# Patient Record
Sex: Female | Born: 1990 | Hispanic: Yes | Marital: Married | Smoking: Never smoker
Health system: Southern US, Community
[De-identification: ages and names within clinical notes are randomized; demographics above are authoritative.]

---

## 2016-07-12 ENCOUNTER — Observation Stay: Payer: Medicaid Other | Admitting: Anesthesiology

## 2016-07-12 ENCOUNTER — Observation Stay
Admission: EM | Admit: 2016-07-12 | Discharge: 2016-07-13 | Disposition: A | Discharge status: Home / Self Care | Payer: Medicaid Other | Attending: Obstetrics and Gynecology | Admitting: Obstetrics and Gynecology

## 2016-07-12 ENCOUNTER — Encounter
Admission: EM | Disposition: A | Discharge status: Home / Self Care | Payer: Self-pay | Source: Home / Self Care | Attending: Emergency Medicine

## 2016-07-12 ENCOUNTER — Encounter: Payer: Self-pay | Admitting: Emergency Medicine

## 2016-07-12 ENCOUNTER — Emergency Department: Payer: Medicaid Other

## 2016-07-12 DIAGNOSIS — O99211 Obesity complicating pregnancy, first trimester: Secondary | ICD-10-CM | POA: Insufficient documentation

## 2016-07-12 DIAGNOSIS — K661 Hemoperitoneum: Secondary | ICD-10-CM | POA: Insufficient documentation

## 2016-07-12 DIAGNOSIS — Z3A08 8 weeks gestation of pregnancy: Secondary | ICD-10-CM | POA: Insufficient documentation

## 2016-07-12 DIAGNOSIS — O209 Hemorrhage in early pregnancy, unspecified: Secondary | ICD-10-CM | POA: Diagnosis present

## 2016-07-12 DIAGNOSIS — O009 Unspecified ectopic pregnancy without intrauterine pregnancy: Principal | ICD-10-CM | POA: Diagnosis present

## 2016-07-12 HISTORY — PX: DIAGNOSTIC LAPAROSCOPY WITH REMOVAL OF ECTOPIC PREGNANCY: SHX6449

## 2016-07-12 LAB — CBC
HCT: 42.1 % (ref 35.0–47.0)
Hemoglobin: 14.7 g/dL (ref 12.0–16.0)
MCH: 30.9 pg (ref 26.0–34.0)
MCHC: 35 g/dL (ref 32.0–36.0)
MCV: 88.4 fL (ref 80.0–100.0)
PLATELETS: 227 10*3/uL (ref 150–440)
RBC: 4.76 MIL/uL (ref 3.80–5.20)
RDW: 13.6 % (ref 11.5–14.5)
WBC: 8.7 10*3/uL (ref 3.6–11.0)

## 2016-07-12 LAB — COMPREHENSIVE METABOLIC PANEL
ALK PHOS: 66 U/L (ref 38–126)
ALT: 35 U/L (ref 14–54)
AST: 27 U/L (ref 15–41)
Albumin: 4.2 g/dL (ref 3.5–5.0)
Anion gap: 5 (ref 5–15)
BILIRUBIN TOTAL: 0.6 mg/dL (ref 0.3–1.2)
BUN: 16 mg/dL (ref 6–20)
CALCIUM: 9.2 mg/dL (ref 8.9–10.3)
CO2: 28 mmol/L (ref 22–32)
CREATININE: 0.7 mg/dL (ref 0.44–1.00)
Chloride: 104 mmol/L (ref 101–111)
Glucose, Bld: 106 mg/dL — ABNORMAL HIGH (ref 65–99)
Potassium: 3.9 mmol/L (ref 3.5–5.1)
Sodium: 137 mmol/L (ref 135–145)
Total Protein: 7.4 g/dL (ref 6.5–8.1)

## 2016-07-12 LAB — LIPASE, BLOOD: LIPASE: 22 U/L (ref 11–51)

## 2016-07-12 LAB — URINALYSIS COMPLETE WITH MICROSCOPIC (ARMC ONLY)
BILIRUBIN URINE: NEGATIVE
Bacteria, UA: NONE SEEN
GLUCOSE, UA: NEGATIVE mg/dL
HGB URINE DIPSTICK: NEGATIVE
KETONES UR: NEGATIVE mg/dL
LEUKOCYTES UA: NEGATIVE
Nitrite: NEGATIVE
PH: 5 (ref 5.0–8.0)
Protein, ur: NEGATIVE mg/dL
Specific Gravity, Urine: 1.021 (ref 1.005–1.030)

## 2016-07-12 LAB — POCT PREGNANCY, URINE: Preg Test, Ur: POSITIVE — AB

## 2016-07-12 LAB — TYPE AND SCREEN
ABO/RH(D): O POS
ANTIBODY SCREEN: NEGATIVE

## 2016-07-12 LAB — HCG, QUANTITATIVE, PREGNANCY: HCG, BETA CHAIN, QUANT, S: 204 m[IU]/mL — AB (ref <5)

## 2016-07-12 SURGERY — LAPAROSCOPY, WITH ECTOPIC PREGNANCY SURGICAL TREATMENT
Anesthesia: General | Laterality: Left | Wound class: Clean Contaminated

## 2016-07-12 MED ORDER — ONDANSETRON HCL 4 MG/2ML IJ SOLN
4.0000 mg | Freq: Once | INTRAMUSCULAR | Status: AC
  Administered 2016-07-12: 4 mg via INTRAVENOUS
  Filled 2016-07-12: qty 2

## 2016-07-12 MED ORDER — MIDAZOLAM HCL 2 MG/2ML IJ SOLN
INTRAMUSCULAR | Status: DC | PRN
  Administered 2016-07-12: 2 mg via INTRAVENOUS

## 2016-07-12 MED ORDER — IBUPROFEN 600 MG PO TABS: 600.0000 mg | ORAL_TABLET | Freq: Four times a day (QID) | ORAL | 3 refills | Status: AC | PRN

## 2016-07-12 MED ORDER — GLYCOPYRROLATE 0.2 MG/ML IJ SOLN
INTRAMUSCULAR | Status: DC | PRN
  Administered 2016-07-12: 0.4 mg via INTRAVENOUS

## 2016-07-12 MED ORDER — FENTANYL CITRATE (PF) 100 MCG/2ML IJ SOLN
25.0000 ug | INTRAMUSCULAR | Status: DC | PRN
Start: 2016-07-12 — End: 2016-07-13
  Administered 2016-07-12 (×3): 50 ug via INTRAVENOUS

## 2016-07-12 MED ORDER — ROCURONIUM BROMIDE 100 MG/10ML IV SOLN
INTRAVENOUS | Status: DC | PRN
  Administered 2016-07-12: 20 mg via INTRAVENOUS

## 2016-07-12 MED ORDER — PROPOFOL 10 MG/ML IV BOLUS
INTRAVENOUS | Status: DC | PRN
  Administered 2016-07-12: 200 mg via INTRAVENOUS
  Administered 2016-07-12: 100 mg via INTRAVENOUS

## 2016-07-12 MED ORDER — PROMETHAZINE HCL 25 MG/ML IJ SOLN
INTRAMUSCULAR | Status: AC
  Administered 2016-07-12: 6.25 mg via INTRAVENOUS
  Filled 2016-07-12: qty 1

## 2016-07-12 MED ORDER — LACTATED RINGERS IV SOLN
INTRAVENOUS | Status: DC | PRN
  Administered 2016-07-12 (×2): via INTRAVENOUS

## 2016-07-12 MED ORDER — NEOSTIGMINE METHYLSULFATE 10 MG/10ML IV SOLN
INTRAVENOUS | Status: DC | PRN
  Administered 2016-07-12: 4 mg via INTRAVENOUS

## 2016-07-12 MED ORDER — PROMETHAZINE HCL 25 MG/ML IJ SOLN
6.2500 mg | INTRAMUSCULAR | Status: DC | PRN
  Administered 2016-07-12: 6.25 mg via INTRAVENOUS

## 2016-07-12 MED ORDER — SODIUM CHLORIDE 0.9 % IJ SOLN
INTRAMUSCULAR | Status: AC
  Filled 2016-07-12: qty 10

## 2016-07-12 MED ORDER — BUPIVACAINE HCL 0.5 % IJ SOLN
INTRAMUSCULAR | Status: DC | PRN
  Administered 2016-07-12: 10 mL

## 2016-07-12 MED ORDER — OXYCODONE-ACETAMINOPHEN 5-325 MG PO TABS: 1.0000 | ORAL_TABLET | ORAL | 0 refills | Status: AC | PRN

## 2016-07-12 MED ORDER — ACETAMINOPHEN 325 MG PO TABS
650.0000 mg | ORAL_TABLET | Freq: Once | ORAL | Status: AC
  Administered 2016-07-12: 650 mg via ORAL
  Filled 2016-07-12: qty 2

## 2016-07-12 MED ORDER — SUCCINYLCHOLINE CHLORIDE 20 MG/ML IJ SOLN
INTRAMUSCULAR | Status: DC | PRN
  Administered 2016-07-12: 120 mg via INTRAVENOUS

## 2016-07-12 MED ORDER — OXYCODONE HCL 5 MG/5ML PO SOLN: 5.0000 mg | Freq: Once | ORAL | Status: AC | PRN

## 2016-07-12 MED ORDER — FENTANYL CITRATE (PF) 100 MCG/2ML IJ SOLN
INTRAMUSCULAR | Status: DC | PRN
  Administered 2016-07-12: 100 ug via INTRAVENOUS
  Administered 2016-07-12: 50 ug via INTRAVENOUS
  Administered 2016-07-12: 100 ug via INTRAVENOUS

## 2016-07-12 MED ORDER — ONDANSETRON HCL 4 MG/2ML IJ SOLN
INTRAMUSCULAR | Status: DC | PRN
  Administered 2016-07-12: 4 mg via INTRAVENOUS

## 2016-07-12 MED ORDER — LIDOCAINE HCL (CARDIAC) 20 MG/ML IV SOLN
INTRAVENOUS | Status: DC | PRN
  Administered 2016-07-12: 100 mg via INTRAVENOUS

## 2016-07-12 MED ORDER — OXYCODONE HCL 5 MG PO TABS
ORAL_TABLET | ORAL | Status: AC
  Filled 2016-07-12: qty 1

## 2016-07-12 MED ORDER — FENTANYL CITRATE (PF) 100 MCG/2ML IJ SOLN
INTRAMUSCULAR | Status: AC
  Filled 2016-07-12: qty 2

## 2016-07-12 MED ORDER — PRENATAL MULTIVITAMIN CH: 1.0000 | ORAL_TABLET | Freq: Every day | ORAL | Status: DC

## 2016-07-12 MED ORDER — HYDROMORPHONE HCL 1 MG/ML IJ SOLN
0.5000 mg | Freq: Once | INTRAMUSCULAR | Status: AC
  Administered 2016-07-12: 0.5 mg via INTRAVENOUS
  Filled 2016-07-12: qty 1

## 2016-07-12 MED ORDER — MEPERIDINE HCL 25 MG/ML IJ SOLN: 6.2500 mg | INTRAMUSCULAR | Status: DC | PRN

## 2016-07-12 MED ORDER — BUPIVACAINE HCL (PF) 0.5 % IJ SOLN
INTRAMUSCULAR | Status: AC
  Filled 2016-07-12: qty 30

## 2016-07-12 MED ORDER — FENTANYL CITRATE (PF) 100 MCG/2ML IJ SOLN
INTRAMUSCULAR | Status: AC
  Administered 2016-07-12: 50 ug via INTRAVENOUS
  Filled 2016-07-12: qty 2

## 2016-07-12 MED ORDER — OXYCODONE HCL 5 MG PO TABS
5.0000 mg | ORAL_TABLET | Freq: Once | ORAL | Status: AC | PRN
  Administered 2016-07-12: 5 mg via ORAL

## 2016-07-12 SURGICAL SUPPLY — 33 items
ANCHOR TIS RET SYS 235ML (MISCELLANEOUS) ×3 IMPLANT
BAG URO DRAIN 2000ML W/SPOUT (MISCELLANEOUS) IMPLANT
BLADE SURG SZ11 CARB STEEL (BLADE) ×3 IMPLANT
CANISTER SUCT 1200ML W/VALVE (MISCELLANEOUS) ×3 IMPLANT
CATH FOLEY 2WAY  5CC 16FR (CATHETERS)
CATH ROBINSON RED A/P 16FR (CATHETERS) IMPLANT
CATH URTH 16FR FL 2W BLN LF (CATHETERS) IMPLANT
CHLORAPREP W/TINT 26ML (MISCELLANEOUS) ×3 IMPLANT
GLOVE BIO SURGEON STRL SZ7 (GLOVE) ×3 IMPLANT
GLOVE INDICATOR 7.5 STRL GRN (GLOVE) ×3 IMPLANT
GOWN STRL REUS W/ TWL LRG LVL3 (GOWN DISPOSABLE) ×2 IMPLANT
GOWN STRL REUS W/ TWL XL LVL3 (GOWN DISPOSABLE) IMPLANT
GOWN STRL REUS W/TWL LRG LVL3 (GOWN DISPOSABLE) ×4
GOWN STRL REUS W/TWL XL LVL3 (GOWN DISPOSABLE)
GRASPER SUT TROCAR 14GX15 (MISCELLANEOUS) ×3 IMPLANT
IRRIGATION STRYKERFLOW (MISCELLANEOUS) ×1 IMPLANT
IRRIGATOR STRYKERFLOW (MISCELLANEOUS) ×3
IV LACTATED RINGERS 1000ML (IV SOLUTION) ×3 IMPLANT
KIT RM TURNOVER CYSTO AR (KITS) ×3 IMPLANT
LABEL OR SOLS (LABEL) IMPLANT
LIQUID BAND (GAUZE/BANDAGES/DRESSINGS) ×3 IMPLANT
NS IRRIG 500ML POUR BTL (IV SOLUTION) ×3 IMPLANT
PACK GYN LAPAROSCOPIC (MISCELLANEOUS) ×3 IMPLANT
PAD OB MATERNITY 4.3X12.25 (PERSONAL CARE ITEMS) ×3 IMPLANT
PAD PREP 24X41 OB/GYN DISP (PERSONAL CARE ITEMS) ×3 IMPLANT
SCISSORS METZENBAUM CVD 33 (INSTRUMENTS) IMPLANT
SHEARS HARMONIC ACE PLUS 36CM (ENDOMECHANICALS) ×3 IMPLANT
SLEEVE ENDOPATH XCEL 5M (ENDOMECHANICALS) ×3 IMPLANT
SUT MNCRL AB 4-0 PS2 18 (SUTURE) ×3 IMPLANT
SUT VIC AB 2-0 UR6 27 (SUTURE) ×3 IMPLANT
TROCAR ENDO BLADELESS 11MM (ENDOMECHANICALS) ×3 IMPLANT
TROCAR XCEL NON-BLD 5MMX100MML (ENDOMECHANICALS) ×3 IMPLANT
TUBING INSUFFLATOR HI FLOW (MISCELLANEOUS) ×3 IMPLANT

## 2016-07-12 NOTE — H&P (Signed)
Obstetrics & Gynecology Consult H&P    Chief Complaint:  Abdominal pain   History of Present Illness: Patient is a 25 y.o. with acute onset abdominal pain, also some light vaginal bleeding.  She was unaware of her pregnancy until positive UPT here in the ER.  Unsure LMP sometime in August.  Denies contraceptive use, history of PID, GC or CT.  ER evaluation here revealed HCG level in the 200's no visualized IUP, thin endometrial stripe, right corpus luteum and a complex looking mass on the left adnexa with what appear to be a low volume of hemoperitoneum.    Review of Systems:10 point review of systems  Past Medical History:  History reviewed. No pertinent past medical history.  Past Surgical History:  History reviewed. No pertinent surgical history.  Gynecologic History: denies history of STI or abnormal paps  Obstetric History: G1  Family History:  History reviewed. No pertinent family history.  Social History:  Social History   Social History  . Marital status: Married    Spouse name: N/A  . Number of children: N/A  . Years of education: N/A   Occupational History  . Not on file.   Social History Main Topics  . Smoking status: Never Smoker  . Smokeless tobacco: Never Used  . Alcohol use No  . Drug use: No  . Sexual activity: Not on file   Other Topics Concern  . Not on file   Social History Narrative  . No narrative on file    Allergies:  No Known Allergies  Medications: Prior to Admission medications   Not on File    Physical Exam Vitals: Blood pressure 128/84, pulse 94, temperature 98.4 F (36.9 C), temperature source Oral, resp. rate 18, height '5\' 5"'  (1.651 m), weight 230 lb (104.3 kg), last menstrual period 05/14/2016, SpO2 100 %. General: NAD HEENT: normocephalic, anicteric Pulmonary: no increased work of breathing Cardiovascular: RRR Abdomen: NABS, soft, non-distended, quite tender with some voluntary guarding and rebound Genitourinary:  deferred Extremities: no edema Neurologic: grossly intact Psychiatric: mood appropriate, affect full  Labs: Results for orders placed or performed during the hospital encounter of 07/12/16 (from the past 72 hour(s))  Lipase, blood     Status: None   Collection Time: 07/12/16  3:12 PM  Result Value Ref Range   Lipase 22 11 - 51 U/L  Comprehensive metabolic panel     Status: Abnormal   Collection Time: 07/12/16  3:12 PM  Result Value Ref Range   Sodium 137 135 - 145 mmol/L   Potassium 3.9 3.5 - 5.1 mmol/L   Chloride 104 101 - 111 mmol/L   CO2 28 22 - 32 mmol/L   Glucose, Bld 106 (H) 65 - 99 mg/dL   BUN 16 6 - 20 mg/dL   Creatinine, Ser 0.70 0.44 - 1.00 mg/dL   Calcium 9.2 8.9 - 10.3 mg/dL   Total Protein 7.4 6.5 - 8.1 g/dL   Albumin 4.2 3.5 - 5.0 g/dL   AST 27 15 - 41 U/L   ALT 35 14 - 54 U/L   Alkaline Phosphatase 66 38 - 126 U/L   Total Bilirubin 0.6 0.3 - 1.2 mg/dL   GFR calc non Af Amer >60 >60 mL/min   GFR calc Af Amer >60 >60 mL/min    Comment: (NOTE) The eGFR has been calculated using the CKD EPI equation. This calculation has not been validated in all clinical situations. eGFR's persistently <60 mL/min signify possible Chronic Kidney Disease.  Anion gap 5 5 - 15  CBC     Status: None   Collection Time: 07/12/16  3:12 PM  Result Value Ref Range   WBC 8.7 3.6 - 11.0 K/uL   RBC 4.76 3.80 - 5.20 MIL/uL   Hemoglobin 14.7 12.0 - 16.0 g/dL   HCT 42.1 35.0 - 47.0 %   MCV 88.4 80.0 - 100.0 fL   MCH 30.9 26.0 - 34.0 pg   MCHC 35.0 32.0 - 36.0 g/dL   RDW 13.6 11.5 - 14.5 %   Platelets 227 150 - 440 K/uL  Urinalysis complete, with microscopic     Status: Abnormal   Collection Time: 07/12/16  3:12 PM  Result Value Ref Range   Color, Urine YELLOW (A) YELLOW   APPearance CLEAR (A) CLEAR   Glucose, UA NEGATIVE NEGATIVE mg/dL   Bilirubin Urine NEGATIVE NEGATIVE   Ketones, ur NEGATIVE NEGATIVE mg/dL   Specific Gravity, Urine 1.021 1.005 - 1.030   Hgb urine dipstick  NEGATIVE NEGATIVE   pH 5.0 5.0 - 8.0   Protein, ur NEGATIVE NEGATIVE mg/dL   Nitrite NEGATIVE NEGATIVE   Leukocytes, UA NEGATIVE NEGATIVE   RBC / HPF 0-5 0 - 5 RBC/hpf   WBC, UA 0-5 0 - 5 WBC/hpf   Bacteria, UA NONE SEEN NONE SEEN   Squamous Epithelial / LPF 0-5 (A) NONE SEEN   Mucous PRESENT   hCG, quantitative, pregnancy     Status: Abnormal   Collection Time: 07/12/16  3:12 PM  Result Value Ref Range   hCG, Beta Chain, Quant, S 204 (H) <5 mIU/mL    Comment:          GEST. AGE      CONC.  (mIU/mL)   <=1 WEEK        5 - 50     2 WEEKS       50 - 500     3 WEEKS       100 - 10,000     4 WEEKS     1,000 - 30,000     5 WEEKS     3,500 - 115,000   6-8 WEEKS     12,000 - 270,000    12 WEEKS     15,000 - 220,000        FEMALE AND NON-PREGNANT FEMALE:     LESS THAN 5 mIU/mL   Pregnancy, urine POC     Status: Abnormal   Collection Time: 07/12/16  3:25 PM  Result Value Ref Range   Preg Test, Ur POSITIVE (A) NEGATIVE    Comment:        THE SENSITIVITY OF THIS METHODOLOGY IS >24 mIU/mL   Type and screen     Status: None   Collection Time: 07/12/16  4:59 PM  Result Value Ref Range   ABO/RH(D) O POS    Antibody Screen NEG    Sample Expiration 07/15/2016     Imaging US Ob Comp Less 14 Wks  Result Date: 07/12/2016 CLINICAL DATA:  Abdominal pain, vaginal bleeding. Estimated gestational age by last menstrual period equals 8 weeks 3 days EXAM: OBSTETRIC <14 WK [place] AND TRANSVAGINAL OB US TECHNIQUE: Both transabdominal and transvaginal ultrasound examinations were performed for complete evaluation of the gestation as well as the maternal uterus, adnexal regions, and pelvic cul-de-sac. Transvaginal technique was performed to assess early pregnancy. COMPARISON:  Non FINDINGS: Intrauterine gestational sac: Not identified Yolk sac:  Not identified Embryo:  Not identified Subchorionic hemorrhage:  None  visualized. Maternal uterus/adnexae: Rounded thick-walled hypoechoic lesion in the RIGHT ovary  may represent a corpus luteal cyst. LEFT ovary measures 2.1 x 1.8 x 2.1 cm. Hypoechoic ill-defined tissue adjacent to LEFT ovary measures 3.3 x 3.2 x 3.3 cm. Minimal peripheral blood flow. Moderate volume of mildly echogenic fluid suggests some blood in the posterior cul-de-sac. IMPRESSION: 1. No intrauterine gestation. 2. Potential corpus luteal cyst of the RIGHT ovary. 3. Ill-defined mass/tissue adjacent to the LEFT ovary with moderate volume of mildly complex free fluid suggesting peritoneal blood. Findings are concerning for but not entirely specific for ectopic pregnancy. Findings conveyed Delaine Lame on 07/12/2016  at18:59. Electronically Signed   By: Suzy Bouchard M.D.   On: 07/12/2016 19:01   US Ob Transvaginal  Result Date: 07/12/2016 CLINICAL DATA:  Abdominal pain, vaginal bleeding. Estimated gestational age by last menstrual period equals 8 weeks 3 days EXAM: OBSTETRIC <14 WK [place] AND TRANSVAGINAL OB US TECHNIQUE: Both transabdominal and transvaginal ultrasound examinations were performed for complete evaluation of the gestation as well as the maternal uterus, adnexal regions, and pelvic cul-de-sac. Transvaginal technique was performed to assess early pregnancy. COMPARISON:  Non FINDINGS: Intrauterine gestational sac: Not identified Yolk sac:  Not identified Embryo:  Not identified Subchorionic hemorrhage:  None visualized. Maternal uterus/adnexae: Rounded thick-walled hypoechoic lesion in the RIGHT ovary may represent a corpus luteal cyst. LEFT ovary measures 2.1 x 1.8 x 2.1 cm. Hypoechoic ill-defined tissue adjacent to LEFT ovary measures 3.3 x 3.2 x 3.3 cm. Minimal peripheral blood flow. Moderate volume of mildly echogenic fluid suggests some blood in the posterior cul-de-sac. IMPRESSION: 1. No intrauterine gestation. 2. Potential corpus luteal cyst of the RIGHT ovary. 3. Ill-defined mass/tissue adjacent to the LEFT ovary with moderate volume of mildly complex free fluid suggesting peritoneal  blood. Findings are concerning for but not entirely specific for ectopic pregnancy. Findings conveyed Delaine Lame on 07/12/2016  at18:59. Electronically Signed   By: Suzy Bouchard M.D.   On: 07/12/2016 19:01    Assessment: 25 y.o. G1 with apparent ruptured right ectopic pregnancy  Plan:  1) Right ectopic pregnancy - differential includes tubal abortion, ruptured ovarian cyst.  We discussed expectant management with serial CBC and repeat HCG in 48-hrs (inpatient) vs surgical evaluation given high enough suspicion of ectopic.  We also discussed MTX therapy but given hemoperitoneum (although small) this is a contraindication to MTX therapy.  We discussed risk benefits of surgery and patient elects to proceed to the OR - proceed to OR for diagnostic laparoscopy - NPO - no preoperative antibiotics - SCDs

## 2016-07-12 NOTE — Pre-Procedure Instructions (Signed)
Script for oxycodone and ibuprofen given to husband.

## 2016-07-12 NOTE — Op Note (Signed)
Preoperative Diagnosis: 1) 25 y.o.  G1 with ruptured left ectopic pregnancy  Postoperative Diagnosis: 1) 25 y.o. G1 with ruptured left ectopic pregnancy   Operation Performed: Laparoscopic left salpingectomy  Indication: 25 y.o. G1 presenting with abdominal pain, positive HCG 200, and findings consistent with hemoperitoneum  Anesthesia: General  Preoperative Antibiotics: none  Estimated Blood Loss: minimal, 200mL of hemoperitoneum evacuated  IV Fluids: 400mL  Drains or Tubes: none  Implants: none  Specimens Removed: left portion of fallopian tube  Complications: none  Intraoperative Findings: Normal right tubes, ovaries, and uterus.  Ruptured ectopic pregnancy near the fimbriated portion of the left fallopian tube, 200mL of hemoperitoneum  Patient Condition: stable  Procedure in Detail:  Patient was taken to the operating room where she was administered general anesthesia.  She was positioned in the supine position, prepped and draped in the usual sterile fashion.  Prior to proceeding with procedure a time out was performed.  Attention was turned to the patient's pelvis.  An indwelling foley catheter was used to decompress the patient's bladder.   Attention was turned to the patient's abdomen.  The umbilicus was infiltrated with 1% Sensorcaine, before making a stab incision using an 11 blade scalpel.  A 5mm Excel trocar was then used to gain direct entry into the peritoneal cavity utilizing the camera to visualize progress of the trocar during placement.  Once peritoneal entry had been achieved, insufflation was started and pneumoperitoneum established at a pressure of 15mmHg.   Palmers point injected with 1% Sensorcaine and a stab incision was made using an 11 blade scalpel.  A second 5mm excel trocar was place placed at Palmer's point under direct visualization. A third 11mm excel trocar was placed in similar fashion.  Inspection revealed the above noted findings  Hemoperitoneum  was evacuted using a suction irrigator.  The ectopic was mobilized out of the pelvis and transected from it attachments to the ovary and mesosalpinx using a 5mm harmonic device.  The specimen was placed in an endocatch bag and removed through the 11mm port site.  The pedicle was inspected noted to hemostatic. Ureter was visualized peristalsis noted and well away from the site of dissection.  The 11mm port site was closed with a carter thompson and 0 Vicryl stitch.  Pneumoperitoneum was evacuated.  The trocars were removed.  The 11mm trocar site was closed with 4-0 Monocryl in a subcuticular fashion.  All trocar sites were then dressed with surgical skin glue.   Sponge needle and instrument counts were correct time two.  The patient tolerated the procedure well and was taken to the recovery room in stable condition.

## 2016-07-12 NOTE — ED Notes (Signed)
POC Preg POSITIVE.

## 2016-07-12 NOTE — Anesthesia Preprocedure Evaluation (Signed)
Anesthesia Evaluation  Patient identified by MRN, date of birth, ID band Patient awake    Reviewed: Allergy & Precautions, NPO status , Patient's Chart, lab work & pertinent test results  History of Anesthesia Complications Negative for: history of anesthetic complications  Airway Mallampati: II  TM Distance: >3 FB Neck ROM: Full    Dental   Pulmonary neg pulmonary ROS, neg sleep apnea, neg COPD,    breath sounds clear to auscultation- rhonchi (-) wheezing      Cardiovascular Exercise Tolerance: Good (-) hypertension(-) CAD and (-) Past MI  Rhythm:Regular Rate:Normal - Systolic murmurs and - Diastolic murmurs    Neuro/Psych negative neurological ROS  negative psych ROS   GI/Hepatic negative GI ROS, Neg liver ROS,   Endo/Other  negative endocrine ROSneg diabetes  Renal/GU negative Renal ROS     Musculoskeletal   Abdominal (+) + obese,   Peds  Hematology negative hematology ROS (+)   Anesthesia Other Findings Likely ruptured ectopic pregnancy  Reproductive/Obstetrics                             Anesthesia Physical Anesthesia Plan  ASA: II  Anesthesia Plan: General   Post-op Pain Management:    Induction: Intravenous  Airway Management Planned: Oral ETT  Additional Equipment:   Intra-op Plan:   Post-operative Plan: Extubation in OR  Informed Consent: I have reviewed the patients History and Physical, chart, labs and discussed the procedure including the risks, benefits and alternatives for the proposed anesthesia with the patient or authorized representative who has indicated his/her understanding and acceptance.   Dental advisory given  Plan Discussed with: Anesthesiologist and CRNA  Anesthesia Plan Comments:         Anesthesia Quick Evaluation

## 2016-07-12 NOTE — ED Triage Notes (Signed)
Pt c/o lower abd pain worse on L side and vaginal spotting since yesterday.

## 2016-07-12 NOTE — Anesthesia Procedure Notes (Signed)
Procedure Name: Intubation Date/Time: 07/12/2016 9:10 PM Performed by: Clovis FredricksonRISSON, Vani Gunner Pre-anesthesia Checklist: Patient identified, Emergency Drugs available, Suction available, Patient being monitored and Timeout performed Patient Re-evaluated:Patient Re-evaluated prior to inductionOxygen Delivery Method: Circle system utilized Preoxygenation: Pre-oxygenation with 100% oxygen Intubation Type: IV induction Ventilation: Mask ventilation without difficulty Laryngoscope Size: Mac and 4 Grade View: Grade I Tube type: Oral Tube size: 7.0 mm Number of attempts: 1 Airway Equipment and Method: Stylet Placement Confirmation: ETT inserted through vocal cords under direct vision,  positive ETCO2,  CO2 detector and breath sounds checked- equal and bilateral Secured at: 22 cm Tube secured with: Tape Dental Injury: Teeth and Oropharynx as per pre-operative assessment

## 2016-07-12 NOTE — Transfer of Care (Signed)
Immediate Anesthesia Transfer of Care Note  Patient: Bianca Pena  Procedure(s) Performed: Procedure(s): DIAGNOSTIC LAPAROSCOPY WITH REMOVAL OF ECTOPIC PREGNANCY (Left)  Patient Location: PACU  Anesthesia Type:General  Level of Consciousness: awake, alert  and oriented  Airway & Oxygen Therapy: Patient Spontanous Breathing and Patient connected to face mask oxygen  Post-op Assessment: Report given to RN and Post -op Vital signs reviewed and stable  Post vital signs: Reviewed and stable  Last Vitals:  Vitals:   07/12/16 2000 07/12/16 2015  BP: 126/70   Pulse: (!) 115 (!) 102  Resp: 20 20  Temp:      Last Pain:  Vitals:   07/12/16 1838  TempSrc:   PainSc: 4          Complications: No apparent anesthesia complications

## 2016-07-12 NOTE — ED Provider Notes (Signed)
Time Seen: Approximately 1653  I have reviewed the triage notes  Chief Complaint: Abdominal Pain and Vaginal Bleeding   History of Present Illness: Bianca Pena is a 25 y.o. female *who is now gravida 1 para 0 unknown weeks pregnant. Patient states she noticed some lower middle quadrant abdominal pain which radiates toward the left lower quadrant and some spotty vaginal bleeding since yesterday. He denies any fever, dysuria or hematuria. She denies any vaginal discharge. She states she thinks she had a menstrual period last month that she's not sure of the dates. Denies any heavy vaginal bleeding back or flank pain    History reviewed. No pertinent past medical history.  There are no active problems to display for this patient.   History reviewed. No pertinent surgical history.  History reviewed. No pertinent surgical history.    Allergies:  Review of patient's allergies indicates no known allergies.  Family History: History reviewed. No pertinent family history.  Social History: Social History  Substance Use Topics  . Smoking status: Never Smoker  . Smokeless tobacco: Never Used  . Alcohol use No     Review of Systems:   10 point review of systems was performed and was otherwise negative:  Constitutional: No fever Eyes: No visual disturbances ENT: No sore throat, ear pain Cardiac: No chest pain Respiratory: No shortness of breath, wheezing, or stridor Abdomen: Mostly left lower quadrant. No nausea, vomiting, shortness of breath Endocrine: No weight loss, No night sweats Extremities: No peripheral edema, cyanosis Skin: No rashes, easy bruising Neurologic: No focal weakness, trouble with speech or swollowing Urologic: No dysuria, Hematuria, or urinary frequency   Physical Exam:  ED Triage Vitals  Enc Vitals Group     BP 07/12/16 1506 131/83     Pulse Rate 07/12/16 1506 (!) 101     Resp 07/12/16 1506 18     Temp 07/12/16 1506 98.4 F (36.9 C)   Temp Source 07/12/16 1506 Oral     SpO2 07/12/16 1506 99 %     Weight 07/12/16 1506 230 lb (104.3 kg)     Height 07/12/16 1506 5\' 5"  (1.651 m)     Head Circumference --      Peak Flow --      Pain Score 07/12/16 1507 6     Pain Loc --      Pain Edu? --      Excl. in GC? --     General: Awake , Alert , and Oriented times 3; GCS 15 Head: Normal cephalic , atraumatic Eyes: Pupils equal , round, reactive to light Nose/Throat: No nasal drainage, patent upper airway without erythema or exudate.  Neck: Supple, Full range of motion, No anterior adenopathy or palpable thyroid masses Lungs: Clear to ascultation without wheezes , rhonchi, or rales Heart: Regular rate, regular rhythm without murmurs , gallops , or rubs Abdomen: Tender lower middle quadrant and left lower quadrant without a palpable masses. No tenderness over McBurney's point, negative Murphy's sign. Some mild tenderness in the left upper quadrant.        Extremities: 2 plus symmetric pulses. No edema, clubbing or cyanosis Neurologic: normal ambulation, Motor symmetric without deficits, sensory intact Skin: warm, dry, no rashes   Labs:   All laboratory work was reviewed including any pertinent negatives or positives listed below:  Labs Reviewed  COMPREHENSIVE METABOLIC PANEL - Abnormal; Notable for the following:       Result Value   Glucose, Bld 106 (*)    All  other components within normal limits  URINALYSIS COMPLETEWITH MICROSCOPIC ([facility] ONLY) - Abnormal; Notable for the following:    Color, Urine YELLOW (*)    APPearance CLEAR (*)    Squamous Epithelial / LPF 0-5 (*)    All other components within normal limits  POCT PREGNANCY, URINE - Abnormal; Notable for the following:    Preg Test, Ur POSITIVE (*)    All other components within normal limits  LIPASE, BLOOD  CBC  HCG, QUANTITATIVE, PREGNANCY  POC URINE PREG, ED  TYPE AND SCREEN    Radiology:  "US Ob Comp Less 14 Wks  Result Date: 07/12/2016 CLINICAL  DATA:  Abdominal pain, vaginal bleeding. Estimated gestational age by last menstrual period equals 8 weeks 3 days EXAM: OBSTETRIC <14 WK [place] AND TRANSVAGINAL OB US TECHNIQUE: Both transabdominal and transvaginal ultrasound examinations were performed for complete evaluation of the gestation as well as the maternal uterus, adnexal regions, and pelvic cul-de-sac. Transvaginal technique was performed to assess early pregnancy. COMPARISON:  Non FINDINGS: Intrauterine gestational sac: Not identified Yolk sac:  Not identified Embryo:  Not identified Subchorionic hemorrhage:  None visualized. Maternal uterus/adnexae: Rounded thick-walled hypoechoic lesion in the RIGHT ovary may represent a corpus luteal cyst. LEFT ovary measures 2.1 x 1.8 x 2.1 cm. Hypoechoic ill-defined tissue adjacent to LEFT ovary measures 3.3 x 3.2 x 3.3 cm. Minimal peripheral blood flow. Moderate volume of mildly echogenic fluid suggests some blood in the posterior cul-de-sac. IMPRESSION: 1. No intrauterine gestation. 2. Potential corpus luteal cyst of the RIGHT ovary. 3. Ill-defined mass/tissue adjacent to the LEFT ovary with moderate volume of mildly complex free fluid suggesting peritoneal blood. Findings are concerning for but not entirely specific for ectopic pregnancy. Findings conveyed Charlesetta Shanks on 07/12/2016  at18:59. Electronically Signed   By: Genevive Bi M.D.   On: 07/12/2016 19:01   US Ob Transvaginal  Result Date: 07/12/2016 CLINICAL DATA:  Abdominal pain, vaginal bleeding. Estimated gestational age by last menstrual period equals 8 weeks 3 days EXAM: OBSTETRIC <14 WK [place] AND TRANSVAGINAL OB US TECHNIQUE: Both transabdominal and transvaginal ultrasound examinations were performed for complete evaluation of the gestation as well as the maternal uterus, adnexal regions, and pelvic cul-de-sac. Transvaginal technique was performed to assess early pregnancy. COMPARISON:  Non FINDINGS: Intrauterine gestational sac: Not identified  Yolk sac:  Not identified Embryo:  Not identified Subchorionic hemorrhage:  None visualized. Maternal uterus/adnexae: Rounded thick-walled hypoechoic lesion in the RIGHT ovary may represent a corpus luteal cyst. LEFT ovary measures 2.1 x 1.8 x 2.1 cm. Hypoechoic ill-defined tissue adjacent to LEFT ovary measures 3.3 x 3.2 x 3.3 cm. Minimal peripheral blood flow. Moderate volume of mildly echogenic fluid suggests some blood in the posterior cul-de-sac. IMPRESSION: 1. No intrauterine gestation. 2. Potential corpus luteal cyst of the RIGHT ovary. 3. Ill-defined mass/tissue adjacent to the LEFT ovary with moderate volume of mildly complex free fluid suggesting peritoneal blood. Findings are concerning for but not entirely specific for ectopic pregnancy. Findings conveyed Charlesetta Shanks on 07/12/2016  at18:59. Electronically Signed   By: Genevive Bi M.D.   On: 07/12/2016 19:01  "  I personally reviewed the radiologic studies    ED Course:  Patient's ultrasound shows concerns for possible ectopic pregnancy. I spoke to the OB/GYN on call Dr. Bonney Aid. We will follow his recommendations he is agreed to see and evaluate the patient.  She was given Tylenol and Dilaudid for pain control Clinical Course     Assessment:  Possible ectopic pregnancy  Plan:  OB/GYN consultation            Jennye MoccasinBrian S Ronnisha Felber, MD 07/12/16 1944

## 2016-07-13 ENCOUNTER — Encounter: Payer: Self-pay | Admitting: Obstetrics and Gynecology

## 2016-07-13 NOTE — Care Management Note (Signed)
Case Management Note  Patient Details  Name: Bianca Pena MRN: [id number] Date of Birth: 05/15/1991  Subjective/Objective:  Patient went from the emergency room to the the operating room.                 Action/Plan: Patient was discharged home from PACU.    Expected Discharge Date:                  Expected Discharge Plan:     In-House Referral:     Discharge planning Services     Post Acute Care Choice:    Choice offered to:     DME Arranged:    DME Agency:     HH Arranged:    HH Agency:     Status of Service:     If discussed at [employer] Meetings, dates discussed:    Additional Comments:  Caren MacadamMichelle Ziya Coonrod, RN 07/13/2016, 1:16 PM

## 2016-07-13 NOTE — Anesthesia Postprocedure Evaluation (Signed)
Anesthesia Post Note  Patient: Bianca Pena  Procedure(s) Performed: Procedure(s) (LRB): DIAGNOSTIC LAPAROSCOPY WITH REMOVAL OF ECTOPIC PREGNANCY (Left)  Patient location during evaluation: PACU Anesthesia Type: General Level of consciousness: awake and alert and oriented Pain management: pain level controlled Vital Signs Assessment: post-procedure vital signs reviewed and stable Respiratory status: spontaneous breathing, nonlabored ventilation and respiratory function stable Cardiovascular status: blood pressure returned to baseline and stable Postop Assessment: no signs of nausea or vomiting Anesthetic complications: no    Last Vitals:  Vitals:   07/12/16 2330 07/13/16 0000  BP: 129/78 130/76  Pulse: (!) 101 84  Resp: 19 18  Temp:      Last Pain:  Vitals:   07/13/16 0000  TempSrc:   PainSc: 3                  [name]

## 2016-07-16 LAB — SURGICAL PATHOLOGY

## 2016-07-16 NOTE — Discharge Instructions (Signed)
Contact your physician with any problems or Same Day Surgery at [phone], Monday through Friday 6 am to 4 pm, or [place] at [facility] Main number at [phone].   CIRUGIA AMBULATORIA       Instruccionnes de alta    Date Bianca Pena(Fecha) 07/12/16 Saturday   1.  Las drogas que se Dispensing opticianle administraron permaneceran en su cuerpo [employer]asta [place], asi      que por las proximas 24 horas usted no debe:   Conducir Field seismologist(manejar) un automovil   Hacer ninguna decision legal   Tomar ninguna bebida alcoholica  2.  A) [place] puede comenzar una dieta regular.  Es mejor que hoy empiece con                    liquidos y gradualmente anada [address].       B) Puede comer cualquier comida que desee pero es mejor empezar con liquidos,               luego sopitas con galletas saladas y gradualmente llegar a las comidas solidas.  3.  Por favor avise a su medico inmediatamente si usted tiene algun sangrado anormal,       tiene dificultad con la respiracion, enrojecimiento y Engineer, miningdolor en el sitio de la cirugia,     [place], fiebro o dolor que se alivia con [place] [facility]icina.  4.  A) Su visita posoperatoria (despues de su operacion) es con el  Dr.Staebler Date                     Time        B)  Por favor llame para hacer la cita posoperatoria.  5.  Istrucciones especificas : AMBULATORY SURGERY  DISCHARGE INSTRUCTIONS   1) The drugs that you were given will stay in your system until tomorrow so for the next 24 hours you should not:  A) Drive an automobile B) Make any legal decisions C) Drink any alcoholic beverage   2) You may resume regular meals tomorrow.  Today it is better to start with liquids and gradually work up to solid foods.  You may eat anything you prefer, but it is better to start with liquids, then soup and crackers, and gradually work up to solid foods.   3) Please notify your doctor immediately if you have any unusual bleeding, trouble breathing, redness and pain at the surgery site, drainage, fever, or  pain not relieved by medication.    4) Additional Instructions: See ectopic pregnancy sheet        Please contact your physician with any problems or Same Day Surgery at [phone], Monday through Friday 6 am to 4 pm, or [place] [facility] Main number at [phone].

## 2016-10-20 ENCOUNTER — Encounter: Payer: Self-pay | Admitting: Emergency Medicine

## 2016-10-20 ENCOUNTER — Emergency Department
Admission: EM | Admit: 2016-10-20 | Discharge: 2016-10-20 | Disposition: A | Discharge status: Home / Self Care | Payer: Medicaid Other | Attending: Emergency Medicine | Admitting: Emergency Medicine

## 2016-10-20 DIAGNOSIS — N760 Acute vaginitis: Secondary | ICD-10-CM | POA: Insufficient documentation

## 2016-10-20 DIAGNOSIS — B9689 Other specified bacterial agents as the cause of diseases classified elsewhere: Secondary | ICD-10-CM

## 2016-10-20 LAB — COMPREHENSIVE METABOLIC PANEL
ALK PHOS: 72 U/L (ref 38–126)
ALT: 39 U/L (ref 14–54)
ANION GAP: 8 (ref 5–15)
AST: 31 U/L (ref 15–41)
Albumin: 4.2 g/dL (ref 3.5–5.0)
BILIRUBIN TOTAL: 0.6 mg/dL (ref 0.3–1.2)
BUN: 19 mg/dL (ref 6–20)
CALCIUM: 8.9 mg/dL (ref 8.9–10.3)
CO2: 25 mmol/L (ref 22–32)
CREATININE: 0.66 mg/dL (ref 0.44–1.00)
Chloride: 103 mmol/L (ref 101–111)
GFR calc non Af Amer: >60 mL/min (ref >60)
GLUCOSE: 81 mg/dL (ref 65–99)
Potassium: 3.5 mmol/L (ref 3.5–5.1)
SODIUM: 136 mmol/L (ref 135–145)
TOTAL PROTEIN: 7.7 g/dL (ref 6.5–8.1)

## 2016-10-20 LAB — URINALYSIS, COMPLETE (UACMP) WITH MICROSCOPIC
Bilirubin Urine: NEGATIVE
GLUCOSE, UA: NEGATIVE mg/dL
HGB URINE DIPSTICK: NEGATIVE
Ketones, ur: NEGATIVE mg/dL
Leukocytes, UA: NEGATIVE
NITRITE: NEGATIVE
PH: 5 (ref 5.0–8.0)
Protein, ur: NEGATIVE mg/dL
Specific Gravity, Urine: 1.025 (ref 1.005–1.030)

## 2016-10-20 LAB — WET PREP, GENITAL
SPERM: NONE SEEN
TRICH WET PREP: NONE SEEN
Yeast Wet Prep HPF POC: NONE SEEN

## 2016-10-20 LAB — HCG, QUANTITATIVE, PREGNANCY: hCG, Beta Chain, Quant, S: <1 m[IU]/mL (ref <5)

## 2016-10-20 LAB — CBC
HCT: 42.2 % (ref 35.0–47.0)
HEMOGLOBIN: 14.8 g/dL (ref 12.0–16.0)
MCH: 30.8 pg (ref 26.0–34.0)
MCHC: 35 g/dL (ref 32.0–36.0)
MCV: 87.9 fL (ref 80.0–100.0)
PLATELETS: 237 10*3/uL (ref 150–440)
RBC: 4.81 MIL/uL (ref 3.80–5.20)
RDW: 13.1 % (ref 11.5–14.5)
WBC: 10.4 10*3/uL (ref 3.6–11.0)

## 2016-10-20 LAB — CHLAMYDIA/NGC RT PCR (ARMC ONLY)
CHLAMYDIA TR: NOT DETECTED
N gonorrhoeae: NOT DETECTED

## 2016-10-20 LAB — LIPASE, BLOOD: Lipase: 19 U/L (ref 11–51)

## 2016-10-20 MED ORDER — METRONIDAZOLE 500 MG PO TABS: 500.0000 mg | ORAL_TABLET | Freq: Two times a day (BID) | ORAL | 0 refills | Status: AC

## 2016-10-20 NOTE — ED Triage Notes (Signed)
abd pain for couple days worse since yesterday.  Low abd.  Hx ectopic.

## 2016-10-20 NOTE — ED Provider Notes (Signed)
[facility] Emergency Department Provider Note  Time seen: 6:28 PM  I have reviewed the triage vital signs and the nursing notes.   HISTORY  Chief Complaint Abdominal Pain    HPI Bianca Pena is a 26 y.o. female with a past medical history of ectopic pregnancy presents to the emergency department for lower abdominal pain for the past 3 days. According to the patient for the past 3 days she has been experiencing lower abdominal discomfort especially in the suprapubic region. Denies any dysuria. Does state for the past 2 weeks she's been having thicker vaginal discharge. Does not believe she is at risk for STDs, states she was tested approximately 2 months ago and is only been with the same sexual partner since that time. Denies any fever, nausea, vomiting, diarrhea. Denies vaginal bleeding.  History reviewed. No pertinent past medical history.  Patient Active Problem List   Diagnosis Date Noted  . Ectopic pregnancy 07/12/2016    Past Surgical History:  Procedure Laterality Date  . DIAGNOSTIC LAPAROSCOPY WITH REMOVAL OF ECTOPIC PREGNANCY Left 07/12/2016   Procedure: DIAGNOSTIC LAPAROSCOPY WITH REMOVAL OF ECTOPIC PREGNANCY;  Surgeon: Vena AustriaAndreas Staebler, MD;  Location: [facility] ORS;  Service: Gynecology;  Laterality: Left;    Prior to Admission medications   Medication Sig Start Date End Date Taking? Authorizing Provider  ibuprofen (ADVIL,MOTRIN) 600 MG tablet Take 1 tablet (600 mg total) by mouth every 6 (six) hours as needed. 07/12/16   Vena AustriaAndreas Staebler, MD  oxyCODONE-acetaminophen (PERCOCET/ROXICET) 5-325 MG tablet Take 1-2 tablets by mouth every 4 (four) hours as needed. 07/12/16   Vena AustriaAndreas Staebler, MD    No Known Allergies  No family history on file.  Social History Social History  Substance Use Topics  . Smoking status: Never Smoker  . Smokeless tobacco: Never Used  . Alcohol use No    Review of Systems Constitutional: Negative for  fever. Cardiovascular: Negative for chest pain. Respiratory: Negative for shortness of breath. Gastrointestinal: Positive for abdominal discomfort. Negative for nausea, vomiting, diarrhea. Genitourinary: Negative for dysuria. Negative for vaginal bleeding. Positive for vaginal discharge. Neurological: Negative for headache 10-point ROS otherwise negative.  ____________________________________________   PHYSICAL EXAM:  VITAL SIGNS: ED Triage Vitals  Enc Vitals Group     BP 10/20/16 1654 116/88     Pulse Rate 10/20/16 1654 (!) 101     Resp 10/20/16 1654 14     Temp 10/20/16 1654 98.2 F (36.8 C)     Temp Source 10/20/16 1654 Oral     SpO2 10/20/16 1654 98 %     Weight 10/20/16 1655 241 lb (109.3 kg)     Height 10/20/16 1655 5\' 5"  (1.651 m)     Head Circumference --      Peak Flow --      Pain Score 10/20/16 1655 6     Pain Loc --      Pain Edu? --      Excl. in GC? --     Constitutional: Alert and oriented. Well appearing and in no distress. Eyes: Normal exam ENT   Head: Normocephalic and atraumatic   Mouth/Throat: Mucous membranes are moist. Cardiovascular: Normal rate, regular rhythm. No murmur Respiratory: Normal respiratory effort without tachypnea nor retractions. Breath sounds are clear Gastrointestinal: Soft, minimal suprapubic tenderness palpation, otherwise no abdominal tenderness, no rebound or guarding. No distention. Musculoskeletal: Nontender with normal range of motion in all extremities.  Neurologic:  Normal speech and language. No gross focal neurologic deficits  Skin:  Skin is warm, dry and intact.  Psychiatric: Mood and affect are normal.   ____________________________________________    INITIAL IMPRESSION / ASSESSMENT AND PLAN / ED COURSE  Pertinent labs & imaging results that were available during my care of the patient were reviewed by me and considered in my medical decision making (see chart for details).  Patient presents for lower  abdominal discomfort for the past 3 days. States mild vaginal discharge for 2 weeks. Denies any bleeding. Labs are largely within normal limits including negative urinalysis. We'll perform a pelvic examination, wet prep and send gonorrhea/chlamydia. Patient does not believe she is at risk for STDs we will hold off on treatment until results are known.  Wet prep positive for clue cells. We'll discharge on Flagyl. Patient agreeable plan.  ____________________________________________   FINAL CLINICAL IMPRESSION(S) / ED DIAGNOSES  Lower abdominal discomfort    Minna Antis, MD 10/20/16 2025

## 2017-01-22 ENCOUNTER — Emergency Department
Admission: EM | Admit: 2017-01-22 | Discharge: 2017-01-22 | Disposition: A | Discharge status: Home / Self Care | Payer: Medicaid Other | Attending: Student in an Organized Health Care Education/Training Program | Admitting: Student in an Organized Health Care Education/Training Program

## 2017-01-22 ENCOUNTER — Emergency Department: Payer: Medicaid Other

## 2017-01-22 DIAGNOSIS — R102 Pelvic and perineal pain: Secondary | ICD-10-CM

## 2017-01-22 DIAGNOSIS — N83201 Unspecified ovarian cyst, right side: Secondary | ICD-10-CM

## 2017-01-22 DIAGNOSIS — R1031 Right lower quadrant pain: Secondary | ICD-10-CM

## 2017-01-22 LAB — CBC
HCT: 48.1 % — ABNORMAL HIGH (ref 35.0–47.0)
Hemoglobin: 16.4 g/dL — ABNORMAL HIGH (ref 12.0–16.0)
MCH: 30.2 pg (ref 26.0–34.0)
MCHC: 34.2 g/dL (ref 32.0–36.0)
MCV: 88.3 fL (ref 80.0–100.0)
PLATELETS: 250 10*3/uL (ref 150–440)
RBC: 5.45 MIL/uL — AB (ref 3.80–5.20)
RDW: 13.4 % (ref 11.5–14.5)
WBC: 7 10*3/uL (ref 3.6–11.0)

## 2017-01-22 LAB — URINALYSIS, COMPLETE (UACMP) WITH MICROSCOPIC
BILIRUBIN URINE: NEGATIVE
GLUCOSE, UA: NEGATIVE mg/dL
HGB URINE DIPSTICK: NEGATIVE
KETONES UR: NEGATIVE mg/dL
LEUKOCYTES UA: NEGATIVE
NITRITE: NEGATIVE
PH: 5 (ref 5.0–8.0)
PROTEIN: NEGATIVE mg/dL
Specific Gravity, Urine: 1.021 (ref 1.005–1.030)

## 2017-01-22 LAB — COMPREHENSIVE METABOLIC PANEL
ALT: 35 U/L (ref 14–54)
AST: 35 U/L (ref 15–41)
Albumin: 4.7 g/dL (ref 3.5–5.0)
Alkaline Phosphatase: 58 U/L (ref 38–126)
Anion gap: 7 (ref 5–15)
BILIRUBIN TOTAL: 0.8 mg/dL (ref 0.3–1.2)
BUN: 17 mg/dL (ref 6–20)
CHLORIDE: 103 mmol/L (ref 101–111)
CO2: 25 mmol/L (ref 22–32)
CREATININE: 0.73 mg/dL (ref 0.44–1.00)
Calcium: 9.7 mg/dL (ref 8.9–10.3)
Glucose, Bld: 96 mg/dL (ref 65–99)
POTASSIUM: 3.9 mmol/L (ref 3.5–5.1)
Sodium: 135 mmol/L (ref 135–145)
TOTAL PROTEIN: 8.5 g/dL — AB (ref 6.5–8.1)

## 2017-01-22 LAB — CHLAMYDIA/NGC RT PCR (ARMC ONLY)
Chlamydia Tr: NOT DETECTED
N gonorrhoeae: NOT DETECTED

## 2017-01-22 LAB — PREGNANCY, URINE: Preg Test, Ur: NEGATIVE

## 2017-01-22 LAB — WET PREP, GENITAL
Clue Cells Wet Prep HPF POC: NONE SEEN
SPERM: NONE SEEN
TRICH WET PREP: NONE SEEN
YEAST WET PREP: NONE SEEN

## 2017-01-22 LAB — LIPASE, BLOOD: LIPASE: 20 U/L (ref 11–51)

## 2017-01-22 MED ORDER — HYDROCODONE-ACETAMINOPHEN 5-325 MG PO TABS
1.0000 | ORAL_TABLET | Freq: Once | ORAL | Status: AC
Start: 2017-01-22 — End: 2017-01-22
  Administered 2017-01-22: 1 via ORAL
  Filled 2017-01-22: qty 1

## 2017-01-22 MED ORDER — NAPROXEN 500 MG PO TABS: 500.0000 mg | ORAL_TABLET | Freq: Two times a day (BID) | ORAL | 0 refills | Status: AC

## 2017-01-22 MED ORDER — HYDROCODONE-ACETAMINOPHEN 5-325 MG PO TABS: 1.0000 | ORAL_TABLET | ORAL | 0 refills | Status: AC | PRN

## 2017-01-22 NOTE — ED Provider Notes (Signed)
[facility] Emergency Department Provider Note    First MD Initiated Contact with Patient 01/22/17 1141     (approximate)  I have reviewed the triage vital signs and the nursing notes.   HISTORY  Chief Complaint Abdominal Pain    HPI Bianca Pena is a 26 y.o. female with a history of ovarian cyst presents with worsening right lower quadrant abdominal pain. No recent fevers. Patient has not had this type of pain before. Rates it as 6 out of 10 in severity. Denies any bleeding. No vaginal discharge. No burning when she urinates. No flank pain.Normal appetite. Is sexually active. Does have a history of ectopic.    History reviewed. No pertinent past medical history. No family history on file. Past Surgical History:  Procedure Laterality Date  . DIAGNOSTIC LAPAROSCOPY WITH REMOVAL OF ECTOPIC PREGNANCY Left 07/12/2016   Procedure: DIAGNOSTIC LAPAROSCOPY WITH REMOVAL OF ECTOPIC PREGNANCY;  Surgeon: Vena Austria, MD;  Location: [facility] ORS;  Service: Gynecology;  Laterality: Left;   Patient Active Problem List   Diagnosis Date Noted  . Ectopic pregnancy 07/12/2016      Prior to Admission medications   Medication Sig Start Date End Date Taking? Authorizing Provider  HYDROcodone-acetaminophen (NORCO) 5-325 MG tablet Take 1 tablet by mouth every 4 (four) hours as needed for moderate pain. 01/22/17   Willy Eddy, MD  HYDROcodone-acetaminophen (NORCO) 5-325 MG tablet Take 1 tablet by mouth every 4 (four) hours as needed for moderate pain. 01/22/17   Willy Eddy, MD  ibuprofen (ADVIL,MOTRIN) 600 MG tablet Take 1 tablet (600 mg total) by mouth every 6 (six) hours as needed. Patient not taking: Reported on 01/22/2017 07/12/16   Vena Austria, MD  naproxen (NAPROSYN) 500 MG tablet Take 1 tablet (500 mg total) by mouth 2 (two) times daily with a meal. 01/22/17 01/22/18  Willy Eddy, MD  oxyCODONE-acetaminophen (PERCOCET/ROXICET) 5-325 MG tablet Take  1-2 tablets by mouth every 4 (four) hours as needed. Patient not taking: Reported on 01/22/2017 07/12/16   Vena Austria, MD    Allergies Patient has no known allergies.    Social History Social History  Substance Use Topics  . Smoking status: Never Smoker  . Smokeless tobacco: Never Used  . Alcohol use No    Review of Systems Patient denies headaches, rhinorrhea, blurry vision, numbness, shortness of breath, chest pain, edema, cough, abdominal pain, nausea, vomiting, diarrhea, dysuria, fevers, rashes or hallucinations unless otherwise stated above in HPI. ____________________________________________   PHYSICAL EXAM:  VITAL SIGNS: Vitals:   01/22/17 1230 01/22/17 1412  BP: 119/69 113/71  Pulse: 89 97  Resp:  16  Temp:      Constitutional: Alert and oriented. Well appearing and in no acute distress. Eyes: Conjunctivae are normal. PERRL. EOMI. Head: Atraumatic. Nose: No congestion/rhinnorhea. Mouth/Throat: Mucous membranes are moist.  Oropharynx non-erythematous. Neck: No stridor. Painless ROM. No cervical spine tenderness to palpation Hematological/Lymphatic/Immunilogical: No cervical lymphadenopathy. Cardiovascular: Normal rate, regular rhythm. Grossly normal heart sounds.  Good peripheral circulation. Respiratory: Normal respiratory effort.  No retractions. Lungs CTAB. Gastrointestinal: Soft with no significant ttp of RLQ. No distention. No abdominal bruits. No CVA tenderness. Genitourinary: Normal-appearing cervix with no significant cervical motion tenderness. No right or left adnexal tenderness to palpation. Musculoskeletal: No lower extremity tenderness nor edema.  No joint effusions. Neurologic:  Normal speech and language. No gross focal neurologic deficits are appreciated. No gait instability. Skin:  Skin is warm, dry and intact. No rash noted. Psychiatric: Mood and affect are  normal. Speech and behavior are  normal.  ____________________________________________   LABS (all labs ordered are listed, but only abnormal results are displayed)  Results for orders placed or performed during the hospital encounter of 01/22/17 (from the past 24 hour(s))  Lipase, blood     Status: None   Collection Time: 01/22/17 11:05 AM  Result Value Ref Range   Lipase 20 11 - 51 U/L  Comprehensive metabolic panel     Status: Abnormal   Collection Time: 01/22/17 11:05 AM  Result Value Ref Range   Sodium 135 135 - 145 mmol/L   Potassium 3.9 3.5 - 5.1 mmol/L   Chloride 103 101 - 111 mmol/L   CO2 25 22 - 32 mmol/L   Glucose, Bld 96 65 - 99 mg/dL   BUN 17 6 - 20 mg/dL   Creatinine, Ser 2.95 0.44 - 1.00 mg/dL   Calcium 9.7 8.9 - 62.1 mg/dL   Total Protein 8.5 (H) 6.5 - 8.1 g/dL   Albumin 4.7 3.5 - 5.0 g/dL   AST 35 15 - 41 U/L   ALT 35 14 - 54 U/L   Alkaline Phosphatase 58 38 - 126 U/L   Total Bilirubin 0.8 0.3 - 1.2 mg/dL   GFR calc non Af Amer >60 >60 mL/min   GFR calc Af Amer >60 >60 mL/min   Anion gap 7 5 - 15  CBC     Status: Abnormal   Collection Time: 01/22/17 11:05 AM  Result Value Ref Range   WBC 7.0 3.6 - 11.0 K/uL   RBC 5.45 (H) 3.80 - 5.20 MIL/uL   Hemoglobin 16.4 (H) 12.0 - 16.0 g/dL   HCT 30.8 (H) 65.7 - 84.6 %   MCV 88.3 80.0 - 100.0 fL   MCH 30.2 26.0 - 34.0 pg   MCHC 34.2 32.0 - 36.0 g/dL   RDW 96.2 95.2 - 84.1 %   Platelets 250 150 - 440 K/uL  Urinalysis, Complete w Microscopic     Status: Abnormal   Collection Time: 01/22/17 11:05 AM  Result Value Ref Range   Color, Urine YELLOW (A) YELLOW   APPearance HAZY (A) CLEAR   Specific Gravity, Urine 1.021 1.005 - 1.030   pH 5.0 5.0 - 8.0   Glucose, UA NEGATIVE NEGATIVE mg/dL   Hgb urine dipstick NEGATIVE NEGATIVE   Bilirubin Urine NEGATIVE NEGATIVE   Ketones, ur NEGATIVE NEGATIVE mg/dL   Protein, ur NEGATIVE NEGATIVE mg/dL   Nitrite NEGATIVE NEGATIVE   Leukocytes, UA NEGATIVE NEGATIVE   RBC / HPF 0-5 0 - 5 RBC/hpf   WBC, UA 0-5 0  - 5 WBC/hpf   Bacteria, UA RARE (A) NONE SEEN   Squamous Epithelial / LPF 0-5 (A) NONE SEEN  Pregnancy, urine     Status: None   Collection Time: 01/22/17 11:05 AM  Result Value Ref Range   Preg Test, Ur NEGATIVE NEGATIVE  Wet prep, genital     Status: Abnormal   Collection Time: 01/22/17  2:09 PM  Result Value Ref Range   Yeast Wet Prep HPF POC NONE SEEN NONE SEEN   Trich, Wet Prep NONE SEEN NONE SEEN   Clue Cells Wet Prep HPF POC NONE SEEN NONE SEEN   WBC, Wet Prep HPF POC FEW (A) NONE SEEN   Sperm NONE SEEN   Chlamydia/NGC rt PCR ([facility] only)     Status: None   Collection Time: 01/22/17  2:09 PM  Result Value Ref Range   Specimen source GC/Chlam ENDOCERVICAL  Chlamydia Tr NOT DETECTED NOT DETECTED   N gonorrhoeae NOT DETECTED NOT DETECTED   ____________________________________________   ____________________________________________  RADIOLOGY  I personally reviewed all radiographic images ordered to evaluate for the above acute complaints and reviewed radiology reports and findings.  These findings were personally discussed with the patient.  Please see medical record for radiology report.  ____________________________________________   PROCEDURES  Procedure(s) performed:  Procedures    Critical Care performed: no ____________________________________________   INITIAL IMPRESSION / ASSESSMENT AND PLAN / ED COURSE  Pertinent labs & imaging results that were available during my care of the patient were reviewed by me and considered in my medical decision making (see chart for details).  DDX: cyst, torsion, msk strain, stone, appy  Bianca Pena is a 26 y.o. who presents to the ED with Right-sided pelvic pain as described above.  Patient is AFVSS in ED. Exam as above. Given current presentation have considered the above differential.  Based on her history of cyst and acute pain will order ultrasound evaluate for evidence of cyst. Less likely ectopic but it  remains in the differential. Patient otherwise well appearing and in no acute distress.  The patient will be placed on continuous pulse oximetry and telemetry for monitoring.  Laboratory evaluation will be sent to evaluate for the above complaints.      Clinical Course as of Jan 22 2114  Thu Jan 22, 2017  1355 Ultrasound is noted. Patient is not pregnant therefore not consistent with ectopic. Most likely hemorrhagic cyst but will perform pelvic exam to evaluate for any evidence of PID.  [PR]  1419 Pelvic exam with no evidence of cervical motion tenderness and no significant adnexal tenderness to palpation. Not consistent with PID. She does not have a fever and no white count do not feel is consistent with tubo-ovarian abscess. This more consistent with hemorrhagic cyst. Patient is here dynamically stable and appropriate for discharge with close follow-up with OB/GYN.  Have discussed with the patient and available family all diagnostics and treatments performed thus far and all questions were answered to the best of my ability. The patient demonstrates understanding and agreement with plan.   [PR]    Clinical Course User Index [PR] Willy Eddy, MD     ____________________________________________   FINAL CLINICAL IMPRESSION(S) / ED DIAGNOSES  Final diagnoses:  RLQ abdominal pain  Pelvic pain  Cyst of right ovary      NEW MEDICATIONS STARTED DURING THIS VISIT:  Discharge Medication List as of 01/22/2017  3:08 PM    START taking these medications   Details  !! HYDROcodone-acetaminophen (NORCO) 5-325 MG tablet Take 1 tablet by mouth every 4 (four) hours as needed for moderate pain., Starting Thu 01/22/2017, Print    !! HYDROcodone-acetaminophen (NORCO) 5-325 MG tablet Take 1 tablet by mouth every 4 (four) hours as needed for moderate pain., Starting Thu 01/22/2017, Print    naproxen (NAPROSYN) 500 MG tablet Take 1 tablet (500 mg total) by mouth 2 (two) times daily with a meal.,  Starting Thu 01/22/2017, Until Fri 01/22/2018, Print     !! - Potential duplicate medications found. Please discuss with provider.       Note:  This document was prepared using Dragon voice recognition software and may include unintentional dictation errors.    Willy Eddy, MD 01/22/17 2117

## 2017-01-22 NOTE — Discharge Instructions (Signed)

## 2017-01-22 NOTE — ED Triage Notes (Signed)
Pt reports lower right abdominal pain for the past week. Patient also states constipation. Last BM this am. Denies N/V.

## 2017-11-28 ENCOUNTER — Emergency Department
Admission: EM | Admit: 2017-11-28 | Discharge: 2017-11-28 | Disposition: A | Discharge status: Home / Self Care | Payer: Self-pay | Attending: Emergency Medicine | Admitting: Emergency Medicine

## 2017-11-28 ENCOUNTER — Encounter: Payer: Self-pay | Admitting: Emergency Medicine

## 2017-11-28 ENCOUNTER — Emergency Department: Payer: Self-pay

## 2017-11-28 ENCOUNTER — Other Ambulatory Visit: Payer: Self-pay

## 2017-11-28 DIAGNOSIS — R102 Pelvic and perineal pain: Secondary | ICD-10-CM | POA: Insufficient documentation

## 2017-11-28 LAB — COMPREHENSIVE METABOLIC PANEL
ALT: 49 U/L (ref 14–54)
ANION GAP: 10 (ref 5–15)
AST: 55 U/L — ABNORMAL HIGH (ref 15–41)
Albumin: 4.5 g/dL (ref 3.5–5.0)
Alkaline Phosphatase: 56 U/L (ref 38–126)
BILIRUBIN TOTAL: 0.6 mg/dL (ref 0.3–1.2)
BUN: 22 mg/dL — AB (ref 6–20)
CO2: 25 mmol/L (ref 22–32)
Calcium: 9.3 mg/dL (ref 8.9–10.3)
Chloride: 104 mmol/L (ref 101–111)
Creatinine, Ser: 0.8 mg/dL (ref 0.44–1.00)
GFR calc Af Amer: >60 mL/min (ref >60)
Glucose, Bld: 98 mg/dL (ref 65–99)
POTASSIUM: 3.9 mmol/L (ref 3.5–5.1)
Sodium: 139 mmol/L (ref 135–145)
TOTAL PROTEIN: 7.8 g/dL (ref 6.5–8.1)

## 2017-11-28 LAB — URINALYSIS, COMPLETE (UACMP) WITH MICROSCOPIC
BACTERIA UA: NONE SEEN
BILIRUBIN URINE: NEGATIVE
Glucose, UA: NEGATIVE mg/dL
HGB URINE DIPSTICK: NEGATIVE
Ketones, ur: 20 mg/dL — AB
LEUKOCYTES UA: NEGATIVE
NITRITE: NEGATIVE
PH: 6 (ref 5.0–8.0)
Protein, ur: NEGATIVE mg/dL
Specific Gravity, Urine: 1.028 (ref 1.005–1.030)

## 2017-11-28 LAB — CBC
HEMATOCRIT: 44 % (ref 35.0–47.0)
HEMOGLOBIN: 15.1 g/dL (ref 12.0–16.0)
MCH: 30.5 pg (ref 26.0–34.0)
MCHC: 34.4 g/dL (ref 32.0–36.0)
MCV: 88.7 fL (ref 80.0–100.0)
Platelets: 235 10*3/uL (ref 150–440)
RBC: 4.96 MIL/uL (ref 3.80–5.20)
RDW: 13.8 % (ref 11.5–14.5)
WBC: 9.4 10*3/uL (ref 3.6–11.0)

## 2017-11-28 LAB — CHLAMYDIA/NGC RT PCR (ARMC ONLY)
CHLAMYDIA TR: NOT DETECTED
N gonorrhoeae: NOT DETECTED

## 2017-11-28 LAB — WET PREP, GENITAL
Clue Cells Wet Prep HPF POC: NONE SEEN
SPERM: NONE SEEN
Trich, Wet Prep: NONE SEEN
YEAST WET PREP: NONE SEEN

## 2017-11-28 LAB — POCT PREGNANCY, URINE: Preg Test, Ur: NEGATIVE

## 2017-11-28 NOTE — ED Provider Notes (Signed)
[facility] Emergency Department Provider Note   ____________________________________________   First MD Initiated Contact with Patient 11/28/17 [phone]     (approximate)  I have reviewed the triage vital signs and the nursing notes.   HISTORY  Chief Complaint Abdominal Pain   HPI Bianca Pena is a 27 y.o. female patient reports about 3 days of lower abdominal pelvic pressure. There is no real dysuria. There is some increased vaginal discharge. Patient reports she's had cyclic changes with her vaginal discharge. She is not really complaining of any pain more like just pressure and fullness. She wants to know if she could be pregnant.   History reviewed. No pertinent past medical history.  Patient Active Problem List   Diagnosis Date Noted  . Ectopic pregnancy 07/12/2016    Past Surgical History:  Procedure Laterality Date  . DIAGNOSTIC LAPAROSCOPY WITH REMOVAL OF ECTOPIC PREGNANCY Left 07/12/2016   Procedure: DIAGNOSTIC LAPAROSCOPY WITH REMOVAL OF ECTOPIC PREGNANCY;  Surgeon: Vena AustriaAndreas Staebler, MD;  Location: [facility] ORS;  Service: Gynecology;  Laterality: Left;    Prior to Admission medications   Medication Sig Start Date End Date Taking? Authorizing Provider  HYDROcodone-acetaminophen (NORCO) 5-325 MG tablet Take 1 tablet by mouth every 4 (four) hours as needed for moderate pain. Patient not taking: Reported on 11/28/2017 01/22/17   Willy Eddyobinson, Patrick, MD  HYDROcodone-acetaminophen [facility](NORCO) 5-325 MG tablet Take 1 tablet by mouth every 4 (four) hours as needed for moderate pain. Patient not taking: Reported on 11/28/2017 01/22/17   Willy Eddyobinson, Patrick, MD  ibuprofen (ADVIL,MOTRIN) 600 MG tablet Take 1 tablet (600 mg total) by mouth every 6 (six) hours as needed. Patient not taking: Reported on 01/22/2017 07/12/16   Vena AustriaStaebler, Andreas, MD  naproxen (NAPROSYN) 500 MG tablet Take 1 tablet (500 mg total) by mouth 2 (two) times daily with a meal. Patient not taking:  Reported on 11/28/2017 01/22/17 01/22/18  Willy Eddyobinson, Patrick, MD  oxyCODONE-acetaminophen (PERCOCET/ROXICET) 5-325 MG tablet Take 1-2 tablets by mouth every 4 (four) hours as needed. Patient not taking: Reported on 01/22/2017 07/12/16   Vena AustriaStaebler, Andreas, MD    Allergies Patient has no known allergies.  History reviewed. No pertinent family history.  Social History Social History   Tobacco Use  . Smoking status: Never Smoker  . Smokeless tobacco: Never Used  Substance Use Topics  . Alcohol use: No  . Drug use: No    Review of Systems  Constitutional: No fever/chills Eyes: No visual changes. ENT: No sore throat. Cardiovascular: Denies chest pain. Respiratory: Denies shortness of breath. Gastrointestinal: No abdominal pain.  No nausea, no vomiting.  No diarrhea.  No constipation. Genitourinary: Negative for dysuria. Musculoskeletal: Negative for back pain. Skin: Negative for rash. Neurological: Negative for headaches, focal weakness  ____________________________________________   PHYSICAL EXAM:  VITAL SIGNS: ED Triage Vitals [11/28/17 0417]  Enc Vitals Group     BP 130/78     Pulse Rate 92     Resp 16     Temp 98.7 F (37.1 C)     Temp Source Oral     SpO2 100 %     Weight 245 lb (111.1 kg)     Height 5\' 4"  (1.626 m)     Head Circumference      Peak Flow      Pain Score 7     Pain Loc      Pain Edu?      Excl. in GC?     Constitutional: Alert and oriented. Well appearing  and in no acute distress. Eyes: Conjunctivae are normal.  Head: Atraumatic. Nose: No congestion/rhinnorhea. Mouth/Throat: Mucous membranes are moist.  Oropharynx non-erythematous. Neck: No stridor.  Cardiovascular: Normal rate, regular rhythm. Grossly normal heart sounds.  Good peripheral circulation. Respiratory: Normal respiratory effort.  No retractions. Lungs CTAB. Gastrointestinal: Soft and nontender. No distention. No abdominal bruits. No CVA tenderness. Genitourinary:  normal  perineum. Normal vagina there is a thin whitish discharge present. Patient does not have any adnexal tenderness or cervical motion tenderness although the exam is not comfortable. Musculoskeletal: No lower extremity tenderness nor edema.  No joint effusions. Neurologic:  Normal speech and language. No gross focal neurologic deficits are appreciated.  Skin:  Skin is warm, dry and intact. No rash noted. Psychiatric: Mood and affect are normal. Speech and behavior are normal.  ____________________________________________   LABS (all labs ordered are listed, but only abnormal results are displayed)  Labs Reviewed  WET PREP, GENITAL - Abnormal; Notable for the following components:      Result Value   WBC, Wet Prep HPF POC FEW (*)    All other components within normal limits  COMPREHENSIVE METABOLIC PANEL - Abnormal; Notable for the following components:   BUN 22 (*)    AST 55 (*)    All other components within normal limits  URINALYSIS, COMPLETE (UACMP) WITH MICROSCOPIC - Abnormal; Notable for the following components:   Color, Urine YELLOW (*)    APPearance CLEAR (*)    Ketones, ur 20 (*)    Squamous Epithelial / LPF 0-5 (*)    All other components within normal limits  CHLAMYDIA/NGC RT PCR ([facility] ONLY)  CBC  POC URINE PREG, ED  POCT PREGNANCY, URINE   ____________________________________________  EKG   ____________________________________________  RADIOLOGY  ED MD interpretation:  ultrasound shows only physiological  cyst  Official radiology report(s): US Pelvis Transvanginal Non-ob (tv Only)  Result Date: 11/28/2017 CLINICAL DATA:  Pelvic fullness and pain for 3 days. EXAM: TRANSABDOMINAL AND TRANSVAGINAL ULTRASOUND OF PELVIS TECHNIQUE: Both transabdominal and transvaginal ultrasound examinations of the pelvis were performed. Transabdominal technique was performed for global imaging of the pelvis including uterus, ovaries, adnexal regions, and pelvic cul-de-sac.  It was necessary to proceed with endovaginal exam following the transabdominal exam to visualize the uterus, ovaries and adnexal regions. COMPARISON:  01/22/2017. FINDINGS: Uterus Measurements: 9.8 x 4.5 x 4.8 cm. No fibroids or other mass visualized. Probable nabothian cysts. Endometrium Thickness: 9 mm, within normal limits. No focal abnormality visualized. Right ovary Measurements: 2.1 x 0.8 x 1.1 cm. Normal appearance/no adnexal mass. Left ovary Measurements: 4.3 x 3.6 x 3.5 cm. Normal appearance/no adnexal mass. Probable 3.9 cm physiologic cyst. Other findings No abnormal free fluid. IMPRESSION: 1. No acute findings. 2. Probable 3.9 cm physiologic left ovarian cyst. Electronically Signed   By: Leanna Battles M.D.   On: 11/28/2017 10:23   US Pelvis Complete  Result Date: 11/28/2017 CLINICAL DATA:  Pelvic fullness and pain for 3 days. EXAM: TRANSABDOMINAL AND TRANSVAGINAL ULTRASOUND OF PELVIS TECHNIQUE: Both transabdominal and transvaginal ultrasound examinations of the pelvis were performed. Transabdominal technique was performed for global imaging of the pelvis including uterus, ovaries, adnexal regions, and pelvic cul-de-sac. It was necessary to proceed with endovaginal exam following the transabdominal exam to visualize the uterus, ovaries and adnexal regions. COMPARISON:  01/22/2017. FINDINGS: Uterus Measurements: 9.8 x 4.5 x 4.8 cm. No fibroids or other mass visualized. Probable nabothian cysts. Endometrium Thickness: 9 mm, within normal limits. No focal abnormality visualized. Right  ovary Measurements: 2.1 x 0.8 x 1.1 cm. Normal appearance/no adnexal mass. Left ovary Measurements: 4.3 x 3.6 x 3.5 cm. Normal appearance/no adnexal mass. Probable 3.9 cm physiologic cyst. Other findings No abnormal free fluid. IMPRESSION: 1. No acute findings. 2. Probable 3.9 cm physiologic left ovarian cyst. Electronically Signed   By: Leanna Battles M.D.   On: 11/28/2017 10:23     ____________________________________________   PROCEDURES  Procedure(s) performed:   Procedures  Critical Care performed:   ____________________________________________   INITIAL IMPRESSION / ASSESSMENT AND PLAN / ED COURSE  old records including care everywhere were reviewed. Patient's wet prep shows only feud white blood cells. I cannot find any pathological reason for her feeling of fullness. I will have her follow-up with OB/GYN.         ____________________________________________   FINAL CLINICAL IMPRESSION(S) / ED DIAGNOSES  Final diagnoses:  Pelvic pressure in female     ED Discharge Orders    None       Note:  This document was prepared using Dragon voice recognition software and may include unintentional dictation errors.    Arnaldo Natal, MD 11/28/17 1044

## 2017-11-28 NOTE — Discharge Instructions (Signed)
please return for pain fever or vomiting or feeling sicker. Please follow-up with Dr. Chauncey CruelStabler history the OB/GYN on-call he can keep an eye on this for you, he's very good. The culture that we did will come back in a day or 2. If anything turns up we will call you.

## 2017-11-28 NOTE — ED Triage Notes (Signed)
Pt states several days of bilateral lower abd pressure and increased vaginal discharge. Pt states she did have nausea 2 days ago, but has none now. Pt denies diarrhea or known fever. No dysuria per pt.

## 2017-11-28 NOTE — ED Notes (Signed)
Pt to ed with c/o lower abd/pelvic pain x 3 days.  Pt reports increased white vaginal d/c, denies odor, denies itching.

## 2019-07-27 IMAGING — US US TRANSVAGINAL NON-OB
1 series · 14 of 25 positions shown · non-contrast
Comparison: 01/22/2017.

CLINICAL DATA: Pelvic fullness and pain for 3 days.

EXAM:
TRANSABDOMINAL AND TRANSVAGINAL ULTRASOUND OF PELVIS
TECHNIQUE: Both transabdominal and transvaginal ultrasound examinations of the
pelvis were performed. Transabdominal technique was performed for
global imaging of the pelvis including uterus, ovaries, adnexal
regions, and pelvic cul-de-sac. It was necessary to proceed with
endovaginal exam following the transabdominal exam to visualize the
uterus, ovaries and adnexal regions.

[Series 1: us transvaginal non-ob · 0.24mm/px · 14 of 68 slices shown]
[im 1/68]
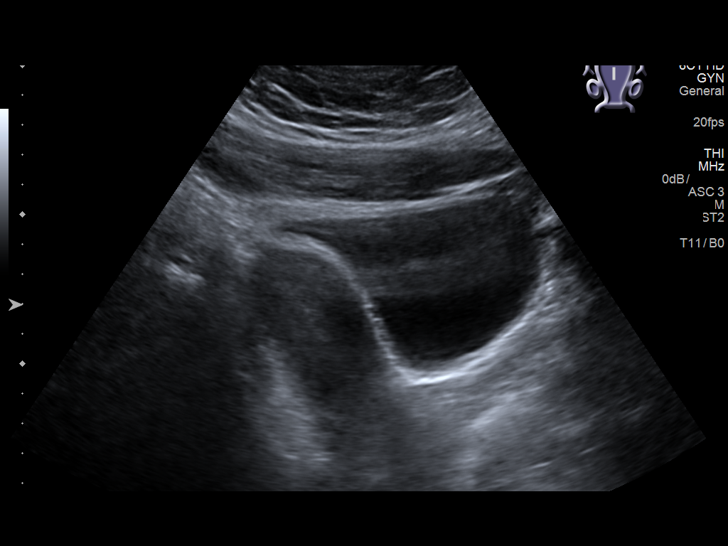
[im 6/68]
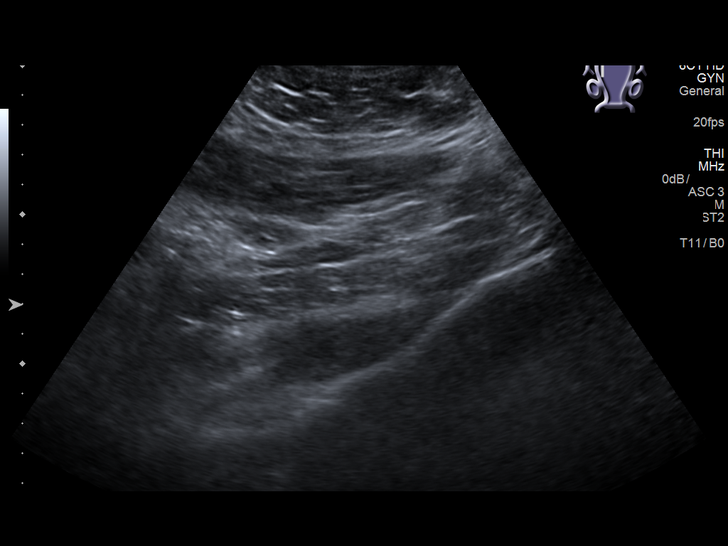
[im 12/68]
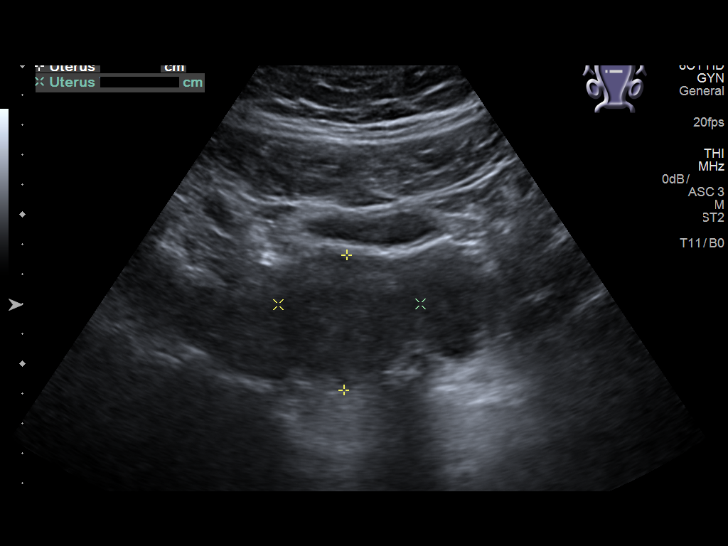
[im 17/68]
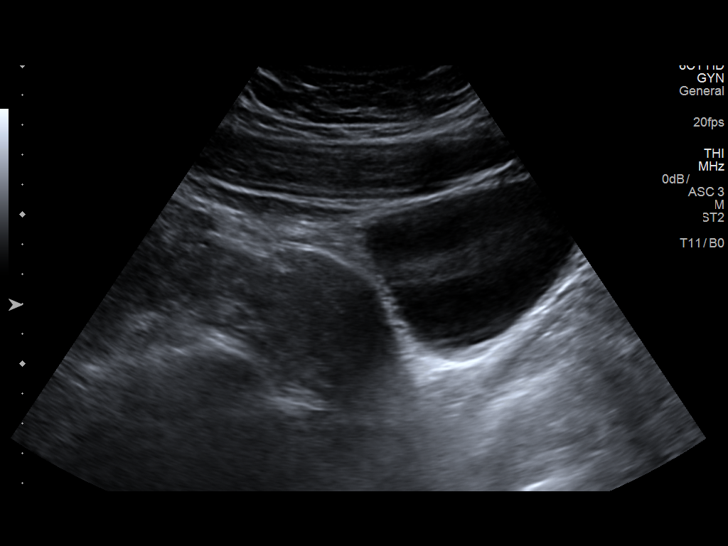
[im 23/68]
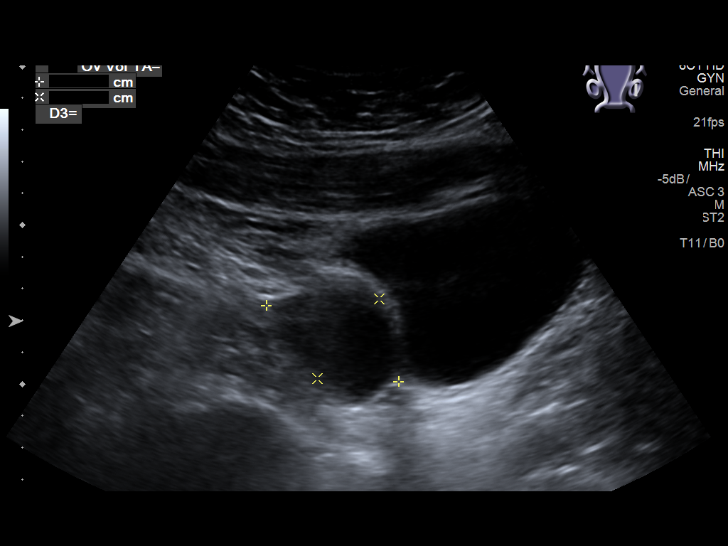
[im 26/68]
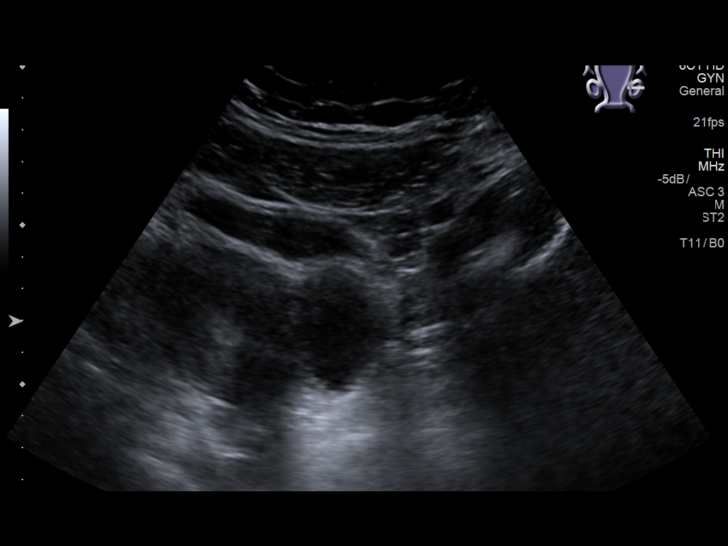
[im 31/68]
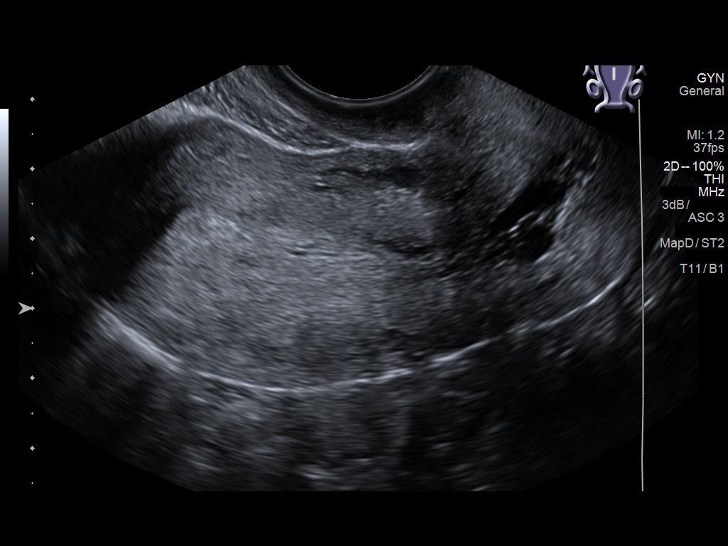
[im 37/68]
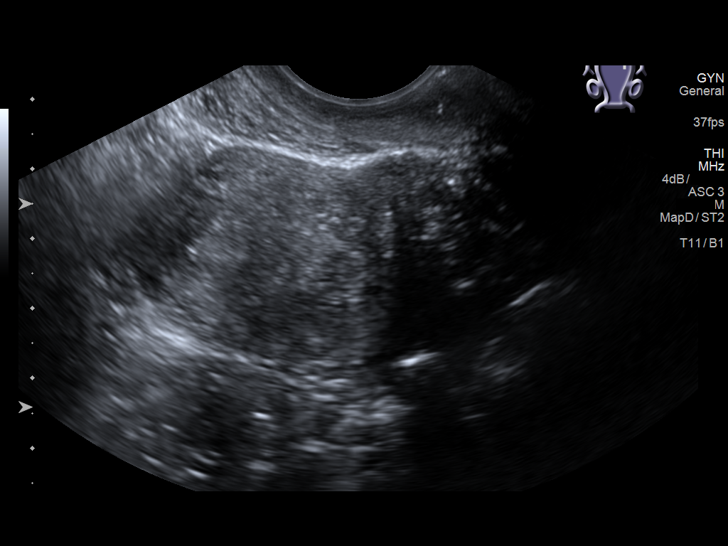
[im 42/68]
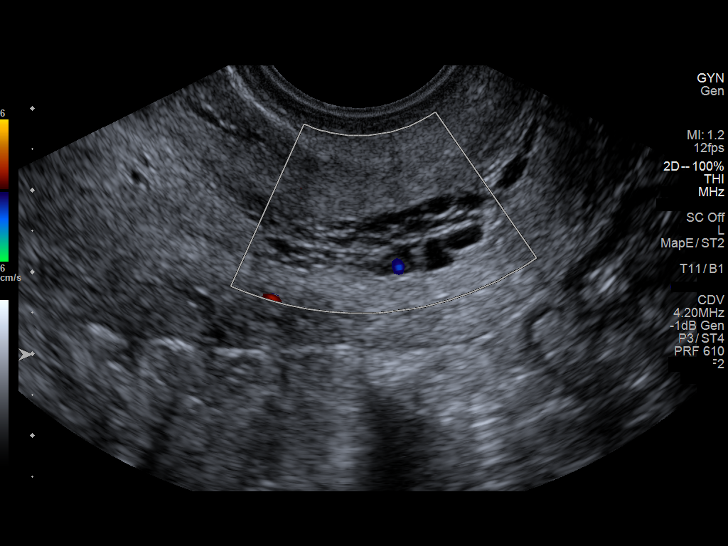
[im 45/68]
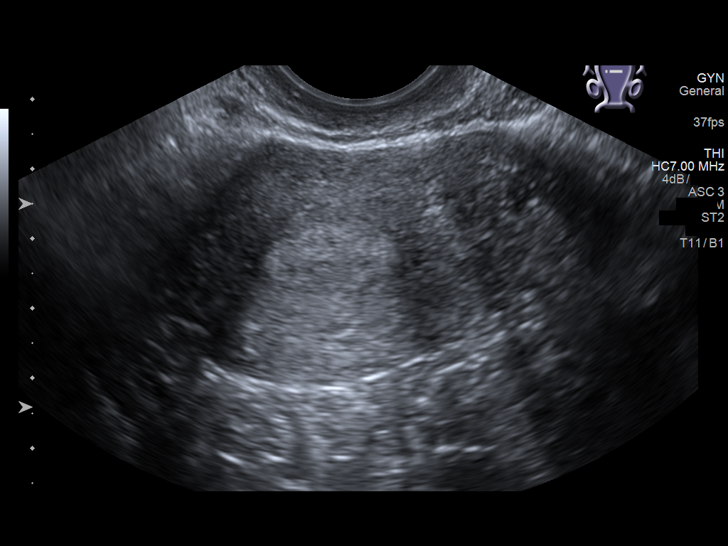
[im 51/68]
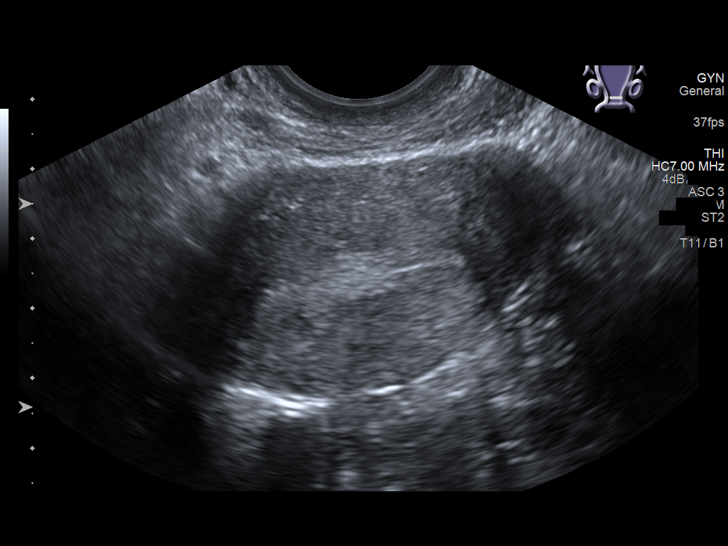
[im 56/68]
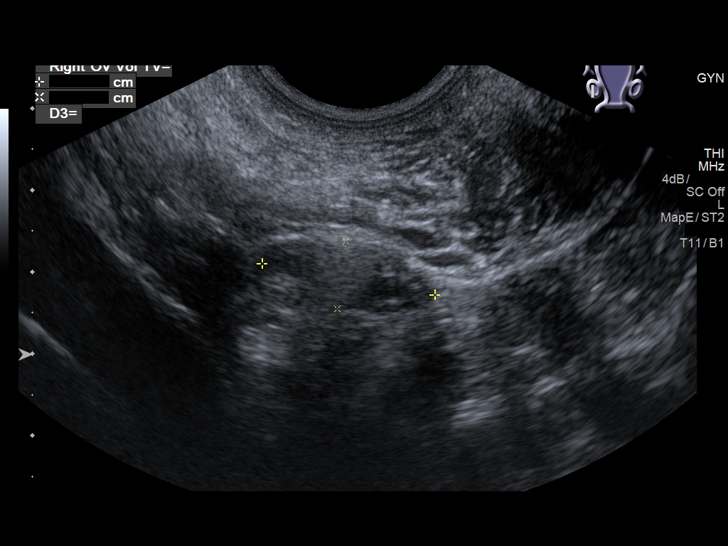
[im 62/68]
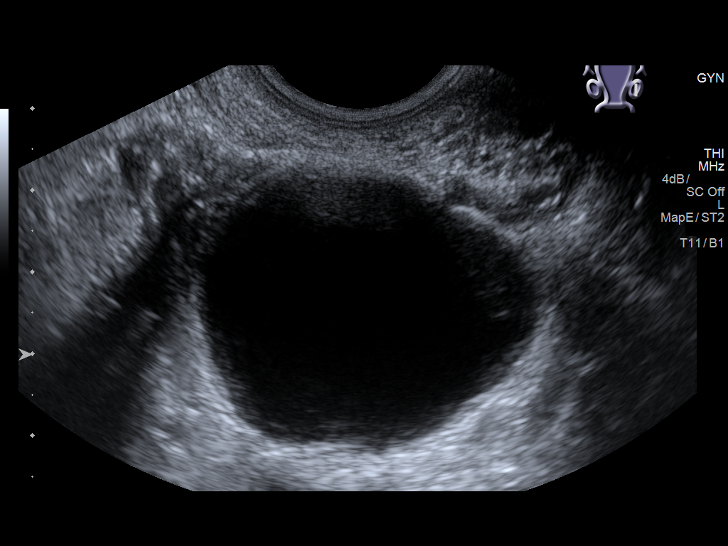
[im 68/68]
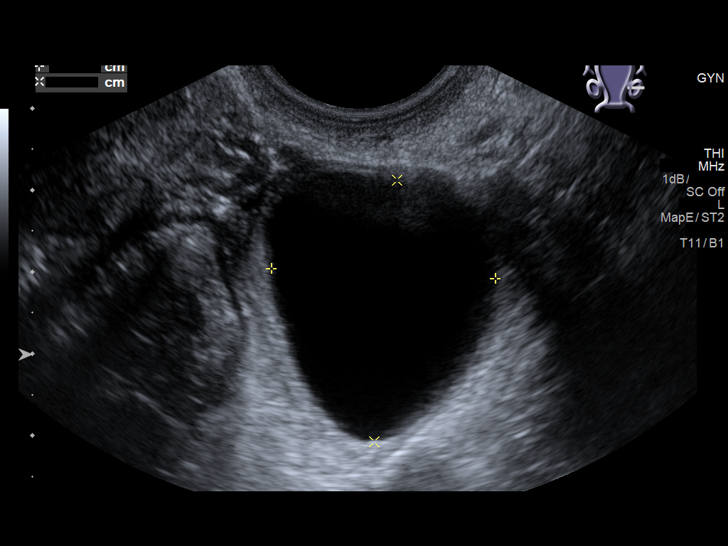

[14 of 25 positions shown; findings below may reference images not displayed]

FINDINGS: Uterus

Measurements: 9.8 x 4.5 x 4.8 cm. No fibroids or other mass
visualized. Probable nabothian cysts.

Endometrium

Thickness: 9 mm, within normal limits. No focal abnormality
visualized.

Right ovary

Measurements: 2.1 x 0.8 x 1.1 cm. Normal appearance/no adnexal mass.

Left ovary

Measurements: 4.3 x 3.6 x 3.5 cm. Normal appearance/no adnexal mass.
Probable 3.9 cm physiologic cyst.

Other findings

No abnormal free fluid.
IMPRESSION: 1. No acute findings.
2. Probable 3.9 cm physiologic left ovarian cyst.

## 2020-07-29 ENCOUNTER — Other Ambulatory Visit: Payer: Self-pay

## 2020-07-29 ENCOUNTER — Emergency Department: Payer: Self-pay

## 2020-07-29 ENCOUNTER — Emergency Department
Admission: EM | Admit: 2020-07-29 | Discharge: 2020-07-29 | Disposition: A | Discharge status: Home / Self Care | Payer: Self-pay | Attending: Emergency Medicine | Admitting: Emergency Medicine

## 2020-07-29 ENCOUNTER — Encounter: Payer: Self-pay | Admitting: Emergency Medicine

## 2020-07-29 DIAGNOSIS — R1030 Lower abdominal pain, unspecified: Secondary | ICD-10-CM | POA: Insufficient documentation

## 2020-07-29 DIAGNOSIS — N390 Urinary tract infection, site not specified: Secondary | ICD-10-CM

## 2020-07-29 DIAGNOSIS — R109 Unspecified abdominal pain: Secondary | ICD-10-CM

## 2020-07-29 LAB — COMPREHENSIVE METABOLIC PANEL
ALT: 109 U/L — ABNORMAL HIGH (ref 0–44)
AST: 135 U/L — ABNORMAL HIGH (ref 15–41)
Albumin: 4.2 g/dL (ref 3.5–5.0)
Alkaline Phosphatase: 91 U/L (ref 38–126)
Anion gap: 10 (ref 5–15)
BUN: 17 mg/dL (ref 6–20)
CO2: 27 mmol/L (ref 22–32)
Calcium: 9.1 mg/dL (ref 8.9–10.3)
Chloride: 102 mmol/L (ref 98–111)
Creatinine, Ser: 1.25 mg/dL — ABNORMAL HIGH (ref 0.44–1.00)
GFR, Estimated: 59 mL/min — ABNORMAL LOW (ref >60)
Glucose, Bld: 133 mg/dL — ABNORMAL HIGH (ref 70–99)
Potassium: 3.6 mmol/L (ref 3.5–5.1)
Sodium: 139 mmol/L (ref 135–145)
Total Bilirubin: 0.5 mg/dL (ref 0.3–1.2)
Total Protein: 8 g/dL (ref 6.5–8.1)

## 2020-07-29 LAB — URINALYSIS, COMPLETE (UACMP) WITH MICROSCOPIC
Bilirubin Urine: NEGATIVE
Glucose, UA: NEGATIVE mg/dL
Hgb urine dipstick: NEGATIVE
Ketones, ur: NEGATIVE mg/dL
Nitrite: NEGATIVE
Protein, ur: NEGATIVE mg/dL
Specific Gravity, Urine: 1.013 (ref 1.005–1.030)
pH: 7 (ref 5.0–8.0)

## 2020-07-29 LAB — CBC
HCT: 43.1 % (ref 36.0–46.0)
Hemoglobin: 15.1 g/dL — ABNORMAL HIGH (ref 12.0–15.0)
MCH: 30.7 pg (ref 26.0–34.0)
MCHC: 35 g/dL (ref 30.0–36.0)
MCV: 87.6 fL (ref 80.0–100.0)
Platelets: 244 10*3/uL (ref 150–400)
RBC: 4.92 MIL/uL (ref 3.87–5.11)
RDW: 12.7 % (ref 11.5–15.5)
WBC: 10.8 10*3/uL — ABNORMAL HIGH (ref 4.0–10.5)
nRBC: 0.2 % (ref 0.0–0.2)

## 2020-07-29 LAB — LIPASE, BLOOD: Lipase: 28 U/L (ref 11–51)

## 2020-07-29 LAB — POCT PREGNANCY, URINE: Preg Test, Ur: NEGATIVE

## 2020-07-29 MED ORDER — MORPHINE SULFATE (PF) 4 MG/ML IV SOLN
4.0000 mg | Freq: Once | INTRAVENOUS | Status: AC
  Administered 2020-07-29: 4 mg via INTRAVENOUS
  Filled 2020-07-29: qty 1

## 2020-07-29 MED ORDER — IOHEXOL 300 MG/ML  SOLN
125.0000 mL | Freq: Once | INTRAMUSCULAR | Status: AC | PRN
  Administered 2020-07-29: 125 mL via INTRAVENOUS

## 2020-07-29 MED ORDER — CEPHALEXIN 500 MG PO CAPS
500.0000 mg | ORAL_CAPSULE | Freq: Once | ORAL | Status: AC
  Administered 2020-07-29: 500 mg via ORAL
  Filled 2020-07-29: qty 1

## 2020-07-29 MED ORDER — DIPHENHYDRAMINE HCL 50 MG/ML IJ SOLN
INTRAMUSCULAR | Status: AC
  Filled 2020-07-29: qty 1

## 2020-07-29 MED ORDER — DIPHENHYDRAMINE HCL 50 MG/ML IJ SOLN
25.0000 mg | Freq: Once | INTRAMUSCULAR | Status: AC
  Administered 2020-07-29: 25 mg via INTRAVENOUS

## 2020-07-29 MED ORDER — ONDANSETRON HCL 4 MG/2ML IJ SOLN
4.0000 mg | Freq: Once | INTRAMUSCULAR | Status: AC
  Administered 2020-07-29: 4 mg via INTRAVENOUS
  Filled 2020-07-29: qty 2

## 2020-07-29 MED ORDER — CEPHALEXIN 500 MG PO CAPS: 500.0000 mg | ORAL_CAPSULE | Freq: Two times a day (BID) | ORAL | 0 refills | Status: AC

## 2020-07-29 MED ORDER — SODIUM CHLORIDE 0.9 % IV BOLUS
1000.0000 mL | Freq: Once | INTRAVENOUS | Status: AC
  Administered 2020-07-29: 1000 mL via INTRAVENOUS

## 2020-07-29 NOTE — ED Notes (Signed)
Per CT tech pt returned from CT with c/o of chest pressure during last portion of contrast administration, pt states the pain has since subsided, EKG obtained, MD aware. No distress noted at this time. VSS.

## 2020-07-29 NOTE — ED Triage Notes (Signed)
Patient with complaint of bilateral lower abdominal pain radiating to her back that started this afternoon. Patient denies nausea and vomiting. Patient with complaint of pain with urination.

## 2020-07-29 NOTE — ED Provider Notes (Signed)
[facility] Emergency Department Provider Note  ____________________________________________   I have reviewed the triage vital signs and the nursing notes.   HISTORY  Chief Complaint Abdominal Pain   History limited by: Not Limited   HPI Bianca Pena is a 29 y.o. female who presents to the emergency department today because of concerns for back and abdominal pain.  Patient states that the symptoms started yesterday evening.  Initially started with discomfort in her bilateral back.  It then came to her abdomen.  She does describe the pain as being located in bilateral lower abdomen.  She denies any associated nausea or vomiting.  She denies any change in urination.  States she has had some intermittent nonbloody diarrhea over the past couple of days. The only time she can recall when she had somewhat similar pain is when she had an ectopic pregnancy.    Records reviewed. Per medical record review patient has a history of ectopic pregnancy.  History reviewed. No pertinent past medical history.  Patient Active Problem List   Diagnosis Date Noted  . Ectopic pregnancy 07/12/2016    Past Surgical History:  Procedure Laterality Date  . DIAGNOSTIC LAPAROSCOPY WITH REMOVAL OF ECTOPIC PREGNANCY Left 07/12/2016   Procedure: DIAGNOSTIC LAPAROSCOPY WITH REMOVAL OF ECTOPIC PREGNANCY;  Surgeon: Vena Austria, MD;  Location: [facility] ORS;  Service: Gynecology;  Laterality: Left;    Prior to Admission medications   Medication Sig Start Date End Date Taking? Authorizing Provider  HYDROcodone-acetaminophen (NORCO) 5-325 MG tablet Take 1 tablet by mouth every 4 (four) hours as needed for moderate pain. Patient not taking: Reported on 11/28/2017 01/22/17   Willy Eddy, MD  HYDROcodone-acetaminophen [facility]) 5-325 MG tablet Take 1 tablet by mouth every 4 (four) hours as needed for moderate pain. Patient not taking: Reported on 11/28/2017 01/22/17   Willy Eddy, MD   ibuprofen (ADVIL,MOTRIN) 600 MG tablet Take 1 tablet (600 mg total) by mouth every 6 (six) hours as needed. Patient not taking: Reported on 01/22/2017 07/12/16   Vena Austria, MD  oxyCODONE-acetaminophen (PERCOCET/ROXICET) 5-325 MG tablet Take 1-2 tablets by mouth every 4 (four) hours as needed. Patient not taking: Reported on 01/22/2017 07/12/16   Vena Austria, MD    Allergies Patient has no known allergies.  No family history on file.  Social History Social History   Tobacco Use  . Smoking status: Never Smoker  . Smokeless tobacco: Never Used  Substance Use Topics  . Alcohol use: Yes    Comment: occ  . Drug use: No    Review of Systems Constitutional: No fever/chills Eyes: No visual changes. ENT: No sore throat. Cardiovascular: Denies chest pain. Respiratory: Denies shortness of breath. Gastrointestinal: Positive for lower abdominal pain. Genitourinary: Negative for dysuria. Musculoskeletal: Positive for back pain. Skin: Negative for rash. Neurological: Negative for headaches, focal weakness or numbness.  ____________________________________________   PHYSICAL EXAM:  VITAL SIGNS: ED Triage Vitals  Enc Vitals Group     BP 07/29/20 0413 (!) 132/96     Pulse Rate 07/29/20 0413 (!) 113     Resp 07/29/20 0413 18     Temp 07/29/20 0413 98.5 F (36.9 C)     Temp Source 07/29/20 0413 Oral     SpO2 07/29/20 0413 100 %     Weight 07/29/20 0414 268 lb (121.6 kg)     Height 07/29/20 0414 5\' 5"  (1.651 m)     Head Circumference --      Peak Flow --  Pain Score 07/29/20 0414 8   Constitutional: Alert and oriented.  Eyes: Conjunctivae are normal.  ENT      Head: Normocephalic and atraumatic.      Nose: No congestion/rhinnorhea.      Mouth/Throat: Mucous membranes are moist.      Neck: No stridor. Hematological/Lymphatic/Immunilogical: No cervical lymphadenopathy. Cardiovascular: Normal rate, regular rhythm.  No murmurs, rubs, or gallops.  Respiratory:  Normal respiratory effort without tachypnea nor retractions. Breath sounds are clear and equal bilaterally. No wheezes/rales/rhonchi. Gastrointestinal: Soft and tender to palpation in the RLQ. No cva tenderness. Genitourinary: Deferred Musculoskeletal: Normal range of motion in all extremities. No lower extremity edema. Neurologic:  Normal speech and language. No gross focal neurologic deficits are appreciated.  Skin:  Skin is warm, dry and intact. No rash noted. Psychiatric: Mood and affect are normal. Speech and behavior are normal. Patient exhibits appropriate insight and judgment.  ____________________________________________    LABS (pertinent positives/negatives)  Upreg negative CMP na 139, k 3.6, glu 133, cr 1.25, ast 135, alt 109 UA clear, small leukocytes, wbc 21-50, rare bacteria CBC wbc 10.8, hgb 15.1, plt 244  ____________________________________________   EKG  None  ____________________________________________    RADIOLOGY  CT abd/pel pending at time of sign out  ____________________________________________   PROCEDURES  Procedures  ____________________________________________   INITIAL IMPRESSION / ASSESSMENT AND PLAN / ED COURSE  Pertinent labs & imaging results that were available during my care of the patient were reviewed by me and considered in my medical decision making (see chart for details).   Patient presents to the emergency department today because of concerns for back and abdominal pain.  Patient was found to be tachycardic on initial vital signs.  Patient with a white count of 10.8.  Notably on exam however patient was quite tender over the right lower quadrant.  This does raise concern for appendicitis.  Urine does have some evidence of urinary tract infection however do wonder if this could be reactive from an appendicitis.  Given concern for appendicitis will obtain CT scan.  Discussed plan with patient. If CT negative would plan to treat  for UTI.  ____________________________________________   FINAL CLINICAL IMPRESSION(S) / ED DIAGNOSES  Final diagnoses:  Abdominal pain, unspecified abdominal location     Note: This dictation was prepared with Dragon dictation. Any transcriptional errors that result from this process are unintentional     Phineas Semen, MD 07/29/20 [phone]

## 2020-07-29 NOTE — ED Notes (Signed)
Pt called RN stating arm was red and swollen at IV site. RN noted that the veins in her arm were inflamed and the area was red. RN told ER provider about the possible allergic reaction and order for benadryl was given. Please see MAR.

## 2020-07-29 NOTE — ED Provider Notes (Signed)
-----------------------------------------   7:06 AM on 07/29/2020 -----------------------------------------  Blood pressure (!) 138/97, pulse (!) 105, temperature 98.2 F (36.8 C), temperature source Oral, resp. rate 18, height 5\' 5"  (1.651 m), weight 121.6 kg, last menstrual period 04/28/2020, SpO2 100 %.  Assuming care from Dr. 04/30/2020.  In short, Bianca Pena is a 29 y.o. female with a chief complaint of Abdominal Pain .  Refer to the original H&P for additional details.  The current plan of care is to follow-up CT results for appendicitis versus UTI.  ----------------------------------------- 7:43 AM on 07/29/2020 -----------------------------------------  Patient had some chest pressure with contrast administration for CT scan, which then resolved after a couple of minutes.  EKG was performed and is unremarkable.  ED ECG REPORT I, 07/31/2020, the attending physician, personally viewed and interpreted this ECG.   Date: 07/29/2020  EKG Time: 7:41  Rate: 107  Rhythm: sinus tachycardia  Axis: Normal  Intervals:none  ST&T Change: None  ----------------------------------------- 8:29 AM on 07/29/2020 -----------------------------------------  CT scan is negative for acute process, appendix was visualized and normal.  At this point, I suspect her symptoms are due to UTI and we will send urine for culture.  She was given initial dose of Keflex and is appropriate for discharge home on Keflex.  She was counseled to return to the ED for new worsening symptoms, patient agrees with plan.     07/31/2020, MD 07/29/20 0830

## 2020-07-31 LAB — URINE CULTURE: Culture: >=100000 — AB

## 2020-12-31 ENCOUNTER — Other Ambulatory Visit: Payer: Self-pay

## 2020-12-31 DIAGNOSIS — R197 Diarrhea, unspecified: Secondary | ICD-10-CM | POA: Insufficient documentation

## 2020-12-31 DIAGNOSIS — R1013 Epigastric pain: Secondary | ICD-10-CM | POA: Insufficient documentation

## 2020-12-31 DIAGNOSIS — E860 Dehydration: Secondary | ICD-10-CM | POA: Insufficient documentation

## 2020-12-31 LAB — URINALYSIS, COMPLETE (UACMP) WITH MICROSCOPIC
Bacteria, UA: NONE SEEN
Bilirubin Urine: NEGATIVE
Glucose, UA: NEGATIVE mg/dL
Hgb urine dipstick: NEGATIVE
Ketones, ur: 5 mg/dL — AB
Leukocytes,Ua: NEGATIVE
Nitrite: NEGATIVE
Protein, ur: NEGATIVE mg/dL
Specific Gravity, Urine: 1.025 (ref 1.005–1.030)
pH: 5 (ref 5.0–8.0)

## 2020-12-31 LAB — CBC
HCT: 45.3 % (ref 36.0–46.0)
Hemoglobin: 15.6 g/dL — ABNORMAL HIGH (ref 12.0–15.0)
MCH: 30.3 pg (ref 26.0–34.0)
MCHC: 34.4 g/dL (ref 30.0–36.0)
MCV: 88 fL (ref 80.0–100.0)
Platelets: 213 10*3/uL (ref 150–400)
RBC: 5.15 MIL/uL — ABNORMAL HIGH (ref 3.87–5.11)
RDW: 12.6 % (ref 11.5–15.5)
WBC: 12.2 10*3/uL — ABNORMAL HIGH (ref 4.0–10.5)
nRBC: 0 % (ref 0.0–0.2)

## 2020-12-31 LAB — COMPREHENSIVE METABOLIC PANEL
ALT: 79 U/L — ABNORMAL HIGH (ref 0–44)
AST: 43 U/L — ABNORMAL HIGH (ref 15–41)
Albumin: 4.4 g/dL (ref 3.5–5.0)
Alkaline Phosphatase: 71 U/L (ref 38–126)
Anion gap: 7 (ref 5–15)
BUN: 14 mg/dL (ref 6–20)
CO2: 28 mmol/L (ref 22–32)
Calcium: 8.9 mg/dL (ref 8.9–10.3)
Chloride: 102 mmol/L (ref 98–111)
Creatinine, Ser: 0.64 mg/dL (ref 0.44–1.00)
GFR, Estimated: >60 mL/min (ref >60)
Glucose, Bld: 96 mg/dL (ref 70–99)
Potassium: 3.8 mmol/L (ref 3.5–5.1)
Sodium: 137 mmol/L (ref 135–145)
Total Bilirubin: 0.9 mg/dL (ref 0.3–1.2)
Total Protein: 7.8 g/dL (ref 6.5–8.1)

## 2020-12-31 LAB — LIPASE, BLOOD: Lipase: 25 U/L (ref 11–51)

## 2020-12-31 LAB — POC URINE PREG, ED: Preg Test, Ur: NEGATIVE

## 2020-12-31 NOTE — ED Triage Notes (Signed)
Pt presents to ER c/o diarrhea x1 week and vomiting that started today.  Pt states she has been unable to keep anything down today.

## 2021-01-01 ENCOUNTER — Emergency Department
Admission: EM | Admit: 2021-01-01 | Discharge: 2021-01-01 | Disposition: A | Discharge status: Home / Self Care | Payer: Self-pay | Attending: Emergency Medicine | Admitting: Emergency Medicine

## 2021-01-01 DIAGNOSIS — R112 Nausea with vomiting, unspecified: Secondary | ICD-10-CM

## 2021-01-01 DIAGNOSIS — E860 Dehydration: Secondary | ICD-10-CM

## 2021-01-01 MED ORDER — METOCLOPRAMIDE HCL 10 MG PO TABS: 10.0000 mg | ORAL_TABLET | Freq: Three times a day (TID) | ORAL | 0 refills | Status: AC | PRN

## 2021-01-01 MED ORDER — METOCLOPRAMIDE HCL 5 MG/ML IJ SOLN
5.0000 mg | Freq: Once | INTRAMUSCULAR | Status: AC
  Administered 2021-01-01: 5 mg via INTRAVENOUS
  Filled 2021-01-01: qty 2

## 2021-01-01 MED ORDER — DEXTROSE-NACL 5-0.45 % IV SOLN: Freq: Once | INTRAVENOUS | Status: AC

## 2021-01-01 MED ORDER — FAMOTIDINE IN NACL 20-0.9 MG/50ML-% IV SOLN
20.0000 mg | Freq: Once | INTRAVENOUS | Status: AC
  Administered 2021-01-01: 20 mg via INTRAVENOUS
  Filled 2021-01-01: qty 50

## 2021-01-01 NOTE — Discharge Instructions (Addendum)
1.  You may take Reglan every 8 hours as needed for nausea/vomiting. 2.  Clear liquids x12 hours, then bland diet x3 days, then slowly advance diet as tolerated. 3.  Return to the ER for worsening symptoms, persistent vomiting, difficulty breathing or other concerns.

## 2021-01-01 NOTE — ED Provider Notes (Signed)
[facility] Emergency Department Provider Note   ____________________________________________   Event Date/Time   First MD Initiated Contact with Patient 01/01/21 0144     (approximate)  I have reviewed the triage vital signs and the nursing notes.   HISTORY  Chief Complaint Emesis and Diarrhea    HPI Bianca Pena is a 30 y.o. female who presents to the ED from home with a chief complaint of nausea/vomiting/diarrhea.  Patient reports diarrhea x1 week.  Vomiting began today.  Patient reports she has been unable to keep anything down.  Abdominal cramping associated with vomiting.  Denies fever, chills, cough, chest pain, shortness of breath, dysuria.  Denies recent travel or trauma.  Voices no COVID-19 concerns.     Past medical history None  Patient Active Problem List   Diagnosis Date Noted  . Ectopic pregnancy 07/12/2016    Past Surgical History:  Procedure Laterality Date  . DIAGNOSTIC LAPAROSCOPY WITH REMOVAL OF ECTOPIC PREGNANCY Left 07/12/2016   Procedure: DIAGNOSTIC LAPAROSCOPY WITH REMOVAL OF ECTOPIC PREGNANCY;  Surgeon: Vena Austria, MD;  Location: [facility] ORS;  Service: Gynecology;  Laterality: Left;    Prior to Admission medications   Medication Sig Start Date End Date Taking? Authorizing Provider  metoCLOPramide (REGLAN) 10 MG tablet Take 1 tablet (10 mg total) by mouth every 8 (eight) hours as needed for nausea. 01/01/21  Yes Irean Hong, MD  HYDROcodone-acetaminophen (NORCO) 5-325 MG tablet Take 1 tablet by mouth every 4 (four) hours as needed for moderate pain. Patient not taking: Reported on 11/28/2017 01/22/17   Willy Eddy, MD  HYDROcodone-acetaminophen [facility]) 5-325 MG tablet Take 1 tablet by mouth every 4 (four) hours as needed for moderate pain. Patient not taking: Reported on 11/28/2017 01/22/17   Willy Eddy, MD  ibuprofen (ADVIL,MOTRIN) 600 MG tablet Take 1 tablet (600 mg total) by mouth every 6 (six) hours as  needed. Patient not taking: Reported on 01/22/2017 07/12/16   Vena Austria, MD  oxyCODONE-acetaminophen (PERCOCET/ROXICET) 5-325 MG tablet Take 1-2 tablets by mouth every 4 (four) hours as needed. Patient not taking: Reported on 01/22/2017 07/12/16   Vena Austria, MD    Allergies Morphine and related and Zofran [ondansetron hcl]  No family history on file.  Social History Social History   Tobacco Use  . Smoking status: Never Smoker  . Smokeless tobacco: Never Used  Substance Use Topics  . Alcohol use: Yes    Comment: occ  . Drug use: No    Review of Systems  Constitutional: No fever/chills Eyes: No visual changes. ENT: No sore throat. Cardiovascular: Denies chest pain. Respiratory: Denies shortness of breath. Gastrointestinal: No abdominal pain.  Positive for nausea, vomiting and diarrhea.  No constipation. Genitourinary: Negative for dysuria. Musculoskeletal: Negative for back pain. Skin: Negative for rash. Neurological: Negative for headaches, focal weakness or numbness.   ____________________________________________   PHYSICAL EXAM:  VITAL SIGNS: ED Triage Vitals  Enc Vitals Group     BP 12/31/20 2036 (!) 133/94     Pulse Rate 12/31/20 2036 (!) 104     Resp 12/31/20 2036 16     Temp 12/31/20 2036 98.4 F (36.9 C)     Temp Source 12/31/20 2036 Oral     SpO2 12/31/20 2036 99 %     Weight 12/31/20 2037 269 lb (122 kg)     Height 12/31/20 2037 5\' 4"  (1.626 m)     Head Circumference --      Peak Flow --  Pain Score 12/31/20 2037 0     Pain Loc --      Pain Edu? --      Excl. in GC? --     Constitutional: Alert and oriented. Well appearing and in no acute distress. Eyes: Conjunctivae are normal. PERRL. EOMI. Head: Atraumatic. Nose: No congestion/rhinnorhea. Mouth/Throat: Mucous membranes are mildly dry. Neck: No stridor.   Cardiovascular: Normal rate, regular rhythm. Grossly normal heart sounds.  Good peripheral circulation. Respiratory:  Normal respiratory effort.  No retractions. Lungs CTAB. Gastrointestinal: Soft and minimally tender to epigastrium without rebound or guarding. No distention. No abdominal bruits. No CVA tenderness. Musculoskeletal: No lower extremity tenderness nor edema.  No joint effusions. Neurologic:  Normal speech and language. No gross focal neurologic deficits are appreciated. No gait instability. Skin:  Skin is warm, dry and intact. No rash noted. Psychiatric: Mood and affect are normal. Speech and behavior are normal.  ____________________________________________   LABS (all labs ordered are listed, but only abnormal results are displayed)  Labs Reviewed  COMPREHENSIVE METABOLIC PANEL - Abnormal; Notable for the following components:      Result Value   AST 43 (*)    ALT 79 (*)    All other components within normal limits  CBC - Abnormal; Notable for the following components:   WBC 12.2 (*)    RBC 5.15 (*)    Hemoglobin 15.6 (*)    All other components within normal limits  URINALYSIS, COMPLETE (UACMP) WITH MICROSCOPIC - Abnormal; Notable for the following components:   Color, Urine YELLOW (*)    APPearance HAZY (*)    Ketones, ur 5 (*)    All other components within normal limits  POC URINE PREG, ED - Normal  LIPASE, BLOOD   ____________________________________________  EKG  None ____________________________________________  RADIOLOGY I, Jaleal Schliep J, personally viewed and evaluated these images (plain radiographs) as part of my medical decision making, as well as reviewing the written report by the radiologist.  ED MD interpretation: None  Official radiology report(s): No results found.  ____________________________________________   PROCEDURES  Procedure(s) performed (including Critical Care):  Procedures   ____________________________________________   INITIAL IMPRESSION / ASSESSMENT AND PLAN / ED COURSE  As part of my medical decision making, I reviewed the  following data within the electronic MEDICAL RECORD NUMBER Nursing notes reviewed and incorporated, Labs reviewed, Old chart reviewed and Notes from prior ED visits     30 year old female presenting with nausea/vomiting/diarrhea. Differential diagnosis includes, but is not limited to, ovarian cyst, ovarian torsion, acute appendicitis, diverticulitis, urinary tract infection/pyelonephritis, endometriosis, bowel obstruction, colitis, renal colic, gastroenteritis, hernia, fibroids, endometriosis, pregnancy related pain including ectopic pregnancy, etc.  Laboratory and urinalysis remarkable for mild dehydration.  Will initiate IV fluid resuscitation, IV antiemetic, IV Pepcid and reassess.  Clinical Course as of 01/01/21 8469  Tue Jan 01, 2021  0546 Patient tolerated PO without emesis.  Will discharge home with prescription for Zofran to use as needed.  Strict return precautions given.  Patient verbalizes understanding agrees with plan of care. [JS]    Clinical Course User Index [JS] Irean Hong, MD     ____________________________________________   FINAL CLINICAL IMPRESSION(S) / ED DIAGNOSES  Final diagnoses:  Nausea vomiting and diarrhea  Dehydration     ED Discharge Orders         Ordered    metoCLOPramide (REGLAN) 10 MG tablet  Every 8 hours PRN        01/01/21 0548          *  Please note:  Bianca Pena was evaluated in Emergency Department on 01/01/2021 for the symptoms described in the history of present illness. She was evaluated in the context of the global COVID-19 pandemic, which necessitated consideration that the patient might be at risk for infection with the SARS-CoV-2 virus that causes COVID-19. Institutional protocols and algorithms that pertain to the evaluation of patients at risk for COVID-19 are in a state of rapid change based on information released by regulatory bodies including the CDC and federal and state organizations. These policies and algorithms were  followed during the patient's care in the ED.  Some ED evaluations and interventions may be delayed as a result of limited staffing during and the pandemic.*   Note:  This document was prepared using Dragon voice recognition software and may include unintentional dictation errors.   Irean Hong, MD 01/01/21 [phone]

## 2021-01-01 NOTE — ED Notes (Signed)
Pt sts she has been able to tolerate water and nausea is improved at this time.

## 2021-01-01 NOTE — ED Notes (Signed)
Pt given water for PO challenge 

## 2021-01-01 NOTE — ED Notes (Signed)
Pt c/o diarrhea x 1 week and N/V x 1 day. Pt sts unable to tolerate PO today, except ice chips. Denies fever or abdominal pain.

## 2022-06-14 IMAGING — CT CT ABD-PELV W/ CM
2 of 4 series · 16 of 46 positions shown, 18 images · IV contrast (APPLIED)
Comparison: None.

CLINICAL DATA: Right lower quadrant abdominal pain

EXAM:
CT ABDOMEN AND PELVIS WITH CONTRAST
TECHNIQUE: Multidetector CT imaging of the abdomen and pelvis was performed
using the standard protocol following bolus administration of
intravenous contrast.
CONTRAST:  125mL OMNIPAQUE IOHEXOL 300 MG/ML  SOLN

[Series 2: routine abd/pel with · axial · 0.98mm/px · z∈[-560,-60]mm · 13 of 110 slices shown, 15 images]
[im 5/110  soft-tissue]
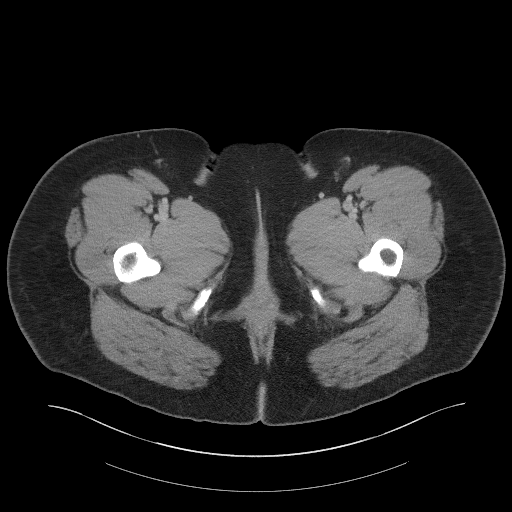
[im 5/110  bone]
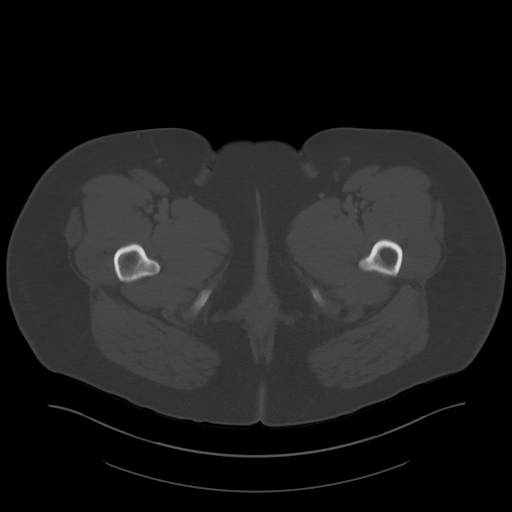
[im 15/110  soft-tissue]
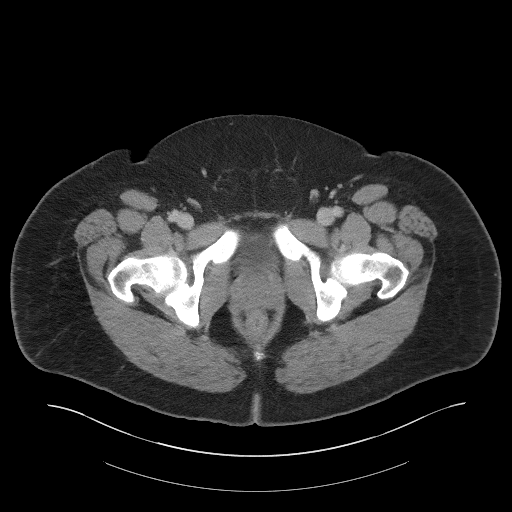
[im 24/110  soft-tissue]
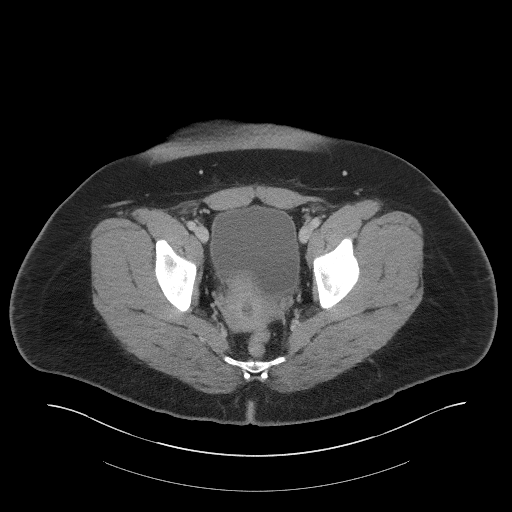
[im 29/110  soft-tissue]
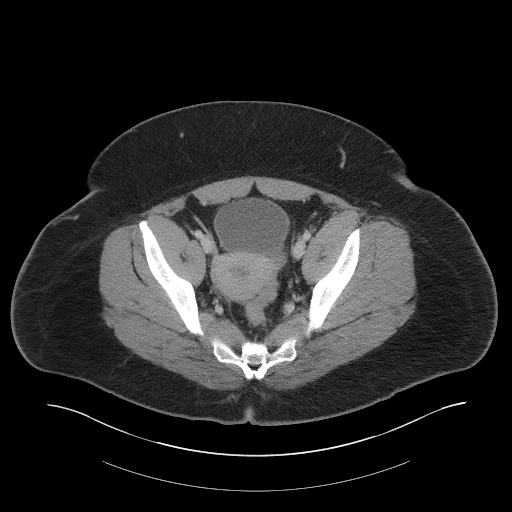
[im 38/110  soft-tissue]
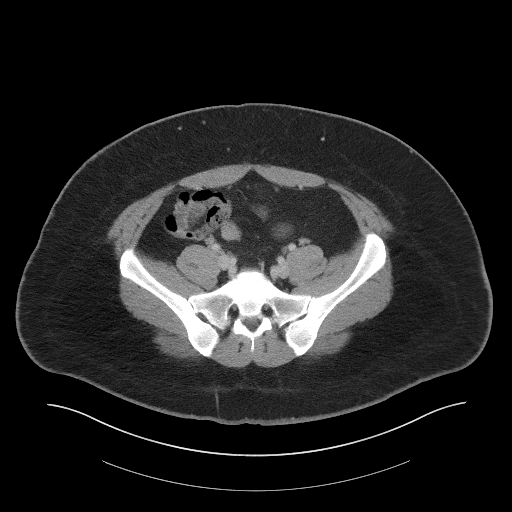
[im 48/110  soft-tissue]
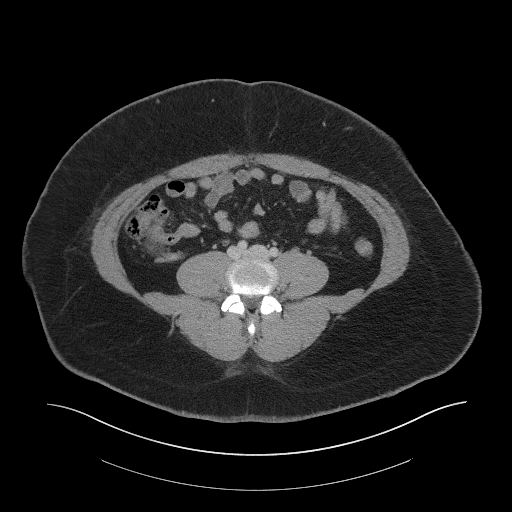
[im 57/110  soft-tissue]
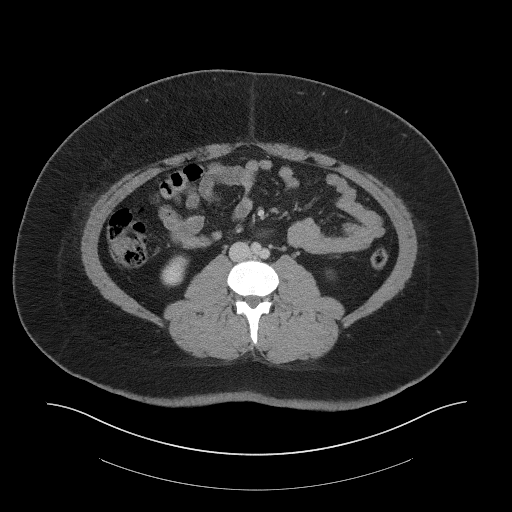
[im 62/110  soft-tissue]
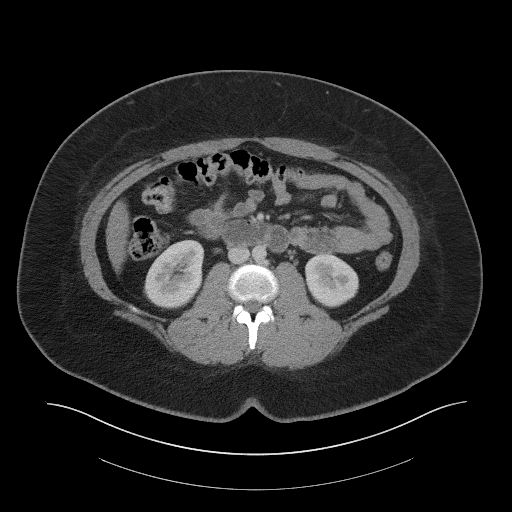
[im 72/110  soft-tissue]
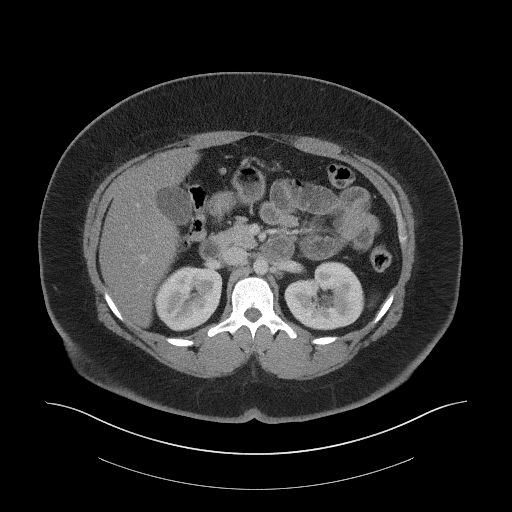
[im 72/110  bone]
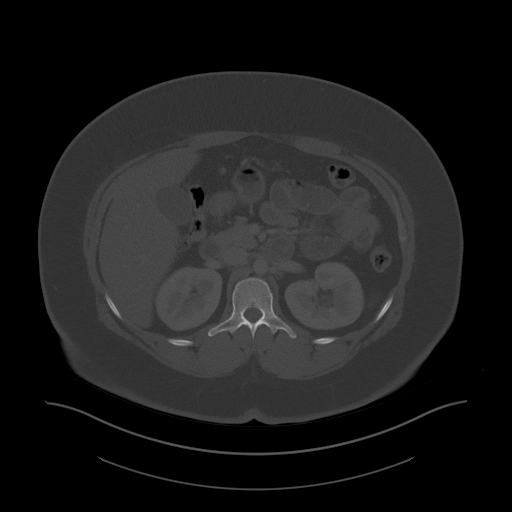
[im 81/110  soft-tissue]
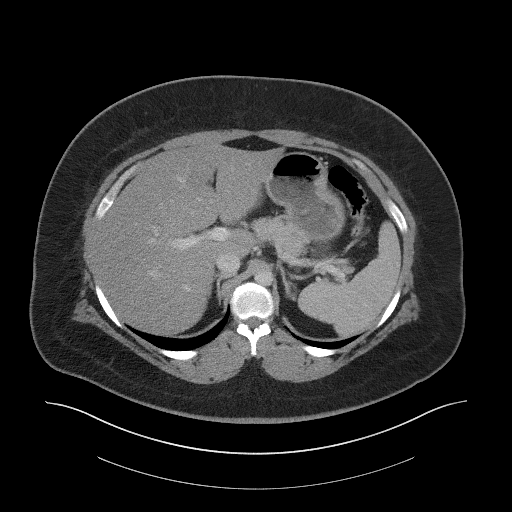
[im 86/110  soft-tissue]
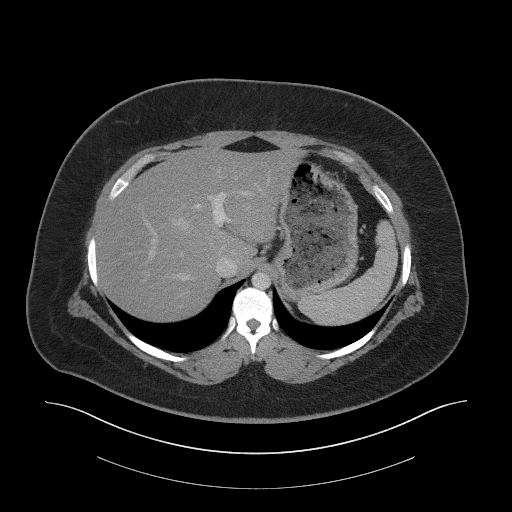
[im 95/110  soft-tissue]
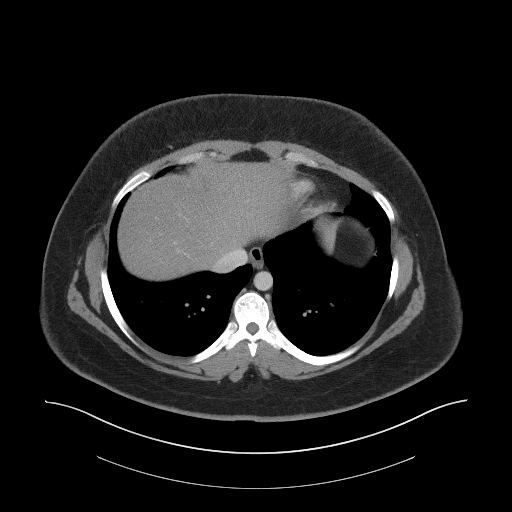
[im 105/110  soft-tissue]
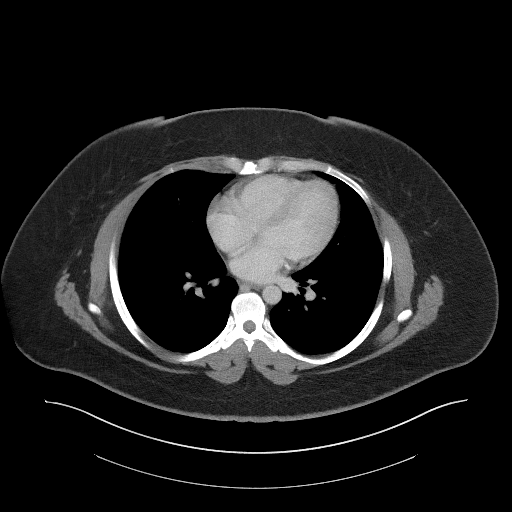

[Series 5: coronal st · coronal · 0.84mm/px · 3 of 110 slices shown]
[im 37/110  soft-tissue]
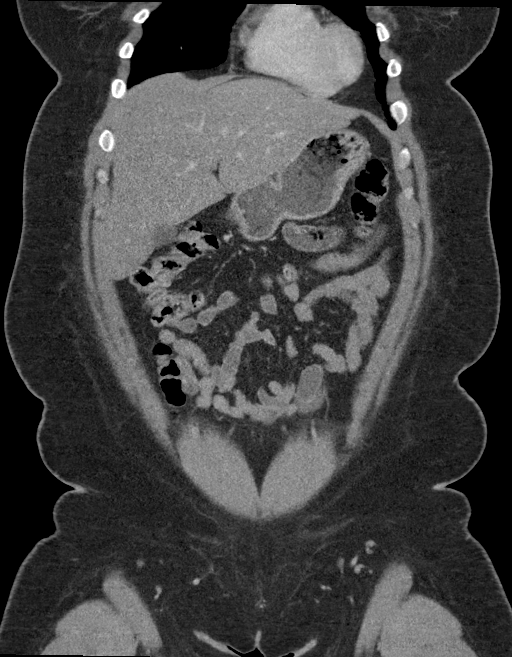
[im 49/110  soft-tissue]
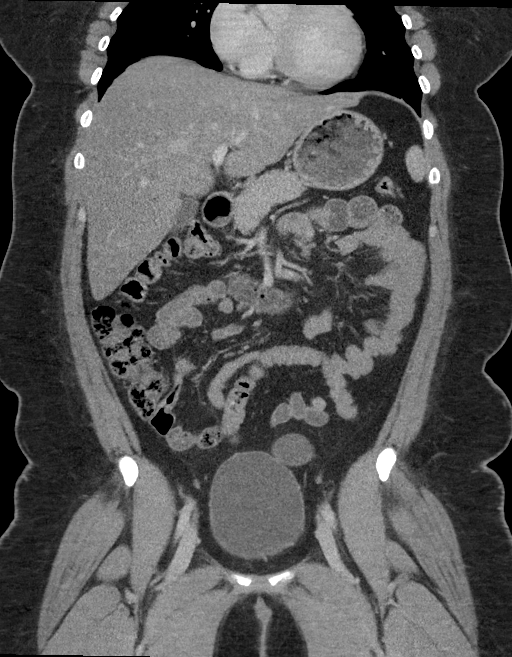
[im 61/110  soft-tissue]
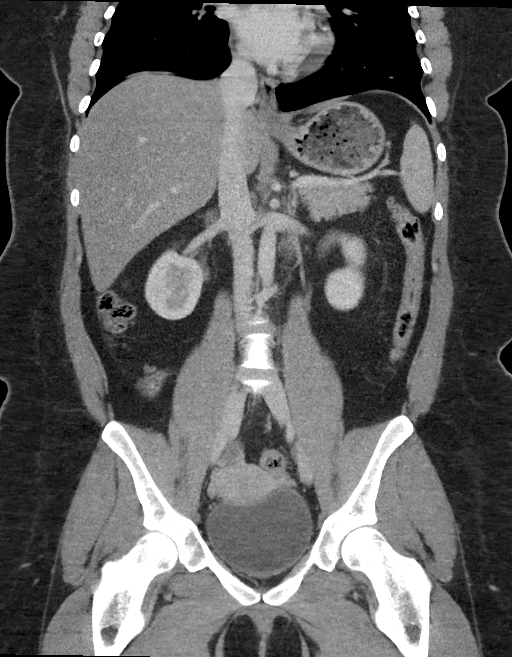

[16 of 46 positions shown; findings below may reference images not displayed]

FINDINGS: Lower chest: Lung bases are clear.

Hepatobiliary: Hepatic steatosis with focal fatty sparing along the
gallbladder fossa.

Gallbladder is unremarkable. No intrahepatic or extrahepatic ductal
dilatation.

Pancreas: Within normal limits.

Spleen: Within normal limits.

Adrenals/Urinary Tract: Adrenal glands are within normal limits.

Kidneys are within normal limits.  No hydronephrosis.

Bladder is within normal limits.

Stomach/Bowel: Stomach is within normal limits.

No evidence of bowel obstruction.

Normal appendix (series 2/image 64).

No colonic wall thickening or inflammatory changes.

Vascular/Lymphatic: No evidence of abdominal aortic aneurysm.

No suspicious abdominopelvic lymphadenopathy.

Reproductive: Uterus is within normal limits.

Right ovary is within normal limits. 3.7 cm left ovarian
cyst/follicle (series 2/image 36), likely physiologic. No follow-up
imaging recommended. Note: This recommendation does not apply to
premenarchal patients and to those with increased risk (genetic,
family history, elevated tumor markers or other high-risk factors)
of ovarian cancer. Reference: JACR [DATE]):[id number]

Other: No abdominopelvic ascites.

Musculoskeletal: Visualized osseous structures are within normal
limits.
IMPRESSION: Normal appendix.

No CT findings to account for the patient's right lower quadrant
pain.

Hepatic steatosis.

## 2024-10-07 NOTE — ED Triage Notes (Signed)
 Heavy vaginal bleeding for the past 5 days with clots. Says she is changing pads every hour. Hx of ectopic, has nto checked pregnany status recently.

## 2024-10-08 NOTE — ED Provider Notes (Signed)
 [facility] Emergency Department Provider Note   ED Clinical Impression   Final diagnoses:  None    Impression, Medical Decision Making, ED Course   Impression: 33 y.o. female with PMH most significant for uterine fibroids and ectopic pregnancy s/p L salpingectomy who presents with a month of heavy vaginal bleeding and abdominal cramping as described below.  Patient overall well-appearing.  Nonacute distress.  Hemodynamically stable and afebrile.  Physical exam reassuring with no evidence of hypertension, tachycardia, tachypnea or hypoxia.  Patient not endorsing any current heavy bleeding at this moment.  Cardiopulmonary exam unremarkable.  Regular rate and rhythm appreciated.  No wheezing or stridor on auscultation.  Abdomen soft, nontender, nondistended.  Discussed with patient benefit of pelvic exam at this time.  Patient states that she would like a pelvic exam to visualize fibroids.  Discussed how pelvic exam would not be the best physical exam to evaluate this concern.  Patient deferred pelvic exam.  Differential diagnosis includes heavy menstrual cycle likely secondary to fibroids.    Discussed lab results with patient -discussed her hemodynamically stable presentation and reassuring lab work.  No evidence of anemia.  No evidence of hypotension.  Afebrile presentation.  Discussed how there is no urgent cause for concern at this time, given reassuring workup.  Patient expressed understanding.  Discussed how referral to gynecology can be made along with short course of birth control to help mitigate heavy menstrual cycle.  Patient expressed understanding.  Anticipate discharge  Diagnostic workup as below.   Orders Placed This Encounter  Procedures   CBC w/ Differential   hCG, Quantitative, Blood   ED Extra Tubes   Pregnancy Qualitative, Urine   Prepare for pelvic exam   Type and Screen with Confirmation ABORh   ABO/RH       MDM Elements   I have reviewed recent and  relevant previous record, including: External ED note - 01/01/2021 [facility] ED for [facility]  Social Determinants that significantly affected care: N/A   ____________________________________________  The case was discussed with the attending physician, who is in agreement with the above assessment and plan.    History   Chief Complaint Chief Complaint  Patient presents with   Vaginal Bleeding    HPI  Bianca Pena is a 33 y.o. female with past medical history as below who presents with vaginal bleeding. The patient reports significant waxing and waning vaginal bleeding for the past month as well as acute abdominal pain, LE cramping, LE paresthesias, and lightheadedness on standing. Her abdominal pain is described as a cramping pressure, noting that she feels like she has to push. She states that her menstrual period began on the 12th of this month and lasted 5 days before stopping. 2 days after her LMP stopped, she began to have intermittent vaginal bleeding, noting that some days she would have profuse bleeding with large clots, some days she would only noticed blood with wiping, and some days she would not endorse any bleeding. She reports saturating about a pad an hour at the worst, although she has been wearing an adult diaper today due to significant bleeding. The worst episode of vaginal bleeding occurred 2 days ago. Of note, she has a history of unusual heavy vaginal bleeding, however she states her bleeding in the past month has been the worst both in terms of pain and amount of bleeding. Additionally, she mentions that she has a known uterine fibroid that was identified to be 5 cm large in January of this  year, and 8 cm in August of this year when it was last evaluated. She now reports feeling a lump to around her uterus when she lays down. She reports a history of ectopic pregnancy, status post L salpingectomy, and does not currently follow with OBGYN. She denies fevers, chest pain,  shortness of breath, nausea, emesis, hematuria, dysuria, urinary retention, diarrhea, constipation, hematochezia, melena, or other abdominal pain.   Outside Historian(s): I have obtained additional history/collateral from none.  Past Medical History[1]  Past Surgical History[2]  Active Medications[3]   Allergies[4]  Family History[5]  Short Social History[6]   Physical Exam   VITAL SIGNS:    Vitals:   10/07/24 1812 10/07/24 1816 10/07/24 2212  BP:  125/87 127/86  Pulse: 93 81 90  Resp: 18 18   Temp:  36.7 C (98.1 F) 36.6 C (97.9 F)  TempSrc:  Oral Oral  SpO2: 100% 100% 100%  Weight:  (!) 111.1 kg (245 lb)     Constitutional: Alert and oriented. No acute distress. Eyes: Conjunctivae are normal. HEENT: Normocephalic and atraumatic. Conjunctivae clear. No congestion. Moist mucous membranes.  Cardiovascular: Rate as above, regular rhythm. Normal and symmetric distal pulses. Brisk capillary refill. Normal skin turgor. Respiratory: Normal respiratory effort. Breath sounds are normal. There are no wheezing or crackles heard. Gastrointestinal: Soft, non-distended, non-tender. Genitourinary: Deferred. Musculoskeletal: Non-tender with normal range of motion in all extremities. Neurologic: Normal speech and language. No gross focal neurologic deficits are appreciated. Patient is moving all extremities equally, face is symmetric at rest and with speech. Skin: Skin is warm, dry and intact. No rash noted. Psychiatric: Mood and affect are normal. Speech and behavior are normal.   Radiology   No orders to display    Pertinent labs & imaging results that were available during my care of the patient were independently interpreted by me and considered in my medical decision making (see chart for details).  Portions of this record have been created using Scientist, clinical (histocompatibility and immunogenetics). Dictation errors have been sought, but may not have been identified and corrected.  Documentation  assistance was provided by Venetia Kuba, Scribe, on October 08, 2024 at 12:09 AM for Fairy Lerner, MD        [1] No past medical history on file. [2] Past Surgical History: Procedure Laterality Date   OTHER SURGICAL HISTORY Left    fallopian tube removal s/p ectopic  [3] No current facility-administered medications for this encounter.   No current outpatient medications on file.  [4] No Known Allergies [5] History reviewed. No pertinent family history. [6] Social History Tobacco Use   Smoking status: Never   Smokeless tobacco: Never  Substance Use Topics   Alcohol use: No   Drug use: No   Lerner Fairy FALCON, MD Resident 10/08/24 1150

## 2024-10-09 ENCOUNTER — Other Ambulatory Visit: Payer: Self-pay

## 2024-10-09 ENCOUNTER — Emergency Department: Payer: Self-pay

## 2024-10-09 ENCOUNTER — Emergency Department
Admission: EM | Admit: 2024-10-09 | Discharge: 2024-10-09 | Disposition: A | Discharge status: Home / Self Care | Payer: Self-pay

## 2024-10-09 DIAGNOSIS — N939 Abnormal uterine and vaginal bleeding, unspecified: Secondary | ICD-10-CM | POA: Insufficient documentation

## 2024-10-09 DIAGNOSIS — D251 Intramural leiomyoma of uterus: Secondary | ICD-10-CM | POA: Insufficient documentation

## 2024-10-09 DIAGNOSIS — D649 Anemia, unspecified: Secondary | ICD-10-CM | POA: Insufficient documentation

## 2024-10-09 DIAGNOSIS — D250 Submucous leiomyoma of uterus: Secondary | ICD-10-CM | POA: Insufficient documentation

## 2024-10-09 LAB — CBC WITH DIFFERENTIAL/PLATELET
Abs Immature Granulocytes: 0.01 K/uL (ref 0.00–0.07)
Basophils Absolute: 0 K/uL (ref 0.0–0.1)
Basophils Relative: 1 %
Eosinophils Absolute: 0.1 K/uL (ref 0.0–0.5)
Eosinophils Relative: 1 %
HCT: 30.4 % — ABNORMAL LOW (ref 36.0–46.0)
Hemoglobin: 10.3 g/dL — ABNORMAL LOW (ref 12.0–15.0)
Immature Granulocytes: 0 %
Lymphocytes Relative: 39 %
Lymphs Abs: 2.4 K/uL (ref 0.7–4.0)
MCH: 29.7 pg (ref 26.0–34.0)
MCHC: 33.9 g/dL (ref 30.0–36.0)
MCV: 87.6 fL (ref 80.0–100.0)
Monocytes Absolute: 0.5 K/uL (ref 0.1–1.0)
Monocytes Relative: 8 %
Neutro Abs: 3.2 K/uL (ref 1.7–7.7)
Neutrophils Relative %: 51 %
Platelets: 250 K/uL (ref 150–400)
RBC: 3.47 MIL/uL — ABNORMAL LOW (ref 3.87–5.11)
RDW: 13.4 % (ref 11.5–15.5)
WBC: 6.2 K/uL (ref 4.0–10.5)
nRBC: 0 % (ref 0.0–0.2)

## 2024-10-09 LAB — COMPREHENSIVE METABOLIC PANEL WITH GFR
ALT: 27 U/L (ref 0–44)
AST: 28 U/L (ref 15–41)
Albumin: 3.9 g/dL (ref 3.5–5.0)
Alkaline Phosphatase: 52 U/L (ref 38–126)
Anion gap: 11 (ref 5–15)
BUN: 18 mg/dL (ref 6–20)
CO2: 23 mmol/L (ref 22–32)
Calcium: 8.7 mg/dL — ABNORMAL LOW (ref 8.9–10.3)
Chloride: 104 mmol/L (ref 98–111)
Creatinine, Ser: 0.76 mg/dL (ref 0.44–1.00)
GFR, Estimated: >60 mL/min
Glucose, Bld: 115 mg/dL — ABNORMAL HIGH (ref 70–99)
Potassium: 3.7 mmol/L (ref 3.5–5.1)
Sodium: 138 mmol/L (ref 135–145)
Total Bilirubin: <0.2 mg/dL (ref 0.0–1.2)
Total Protein: 6.3 g/dL — ABNORMAL LOW (ref 6.5–8.1)

## 2024-10-09 LAB — TYPE AND SCREEN
ABO/RH(D): O POS
Antibody Screen: NEGATIVE

## 2024-10-09 LAB — URINALYSIS, ROUTINE W REFLEX MICROSCOPIC
Bacteria, UA: NONE SEEN
Bilirubin Urine: NEGATIVE
Glucose, UA: NEGATIVE mg/dL
Ketones, ur: NEGATIVE mg/dL
Leukocytes,Ua: NEGATIVE
Nitrite: NEGATIVE
Protein, ur: NEGATIVE mg/dL
Specific Gravity, Urine: 1.03 (ref 1.005–1.030)
pH: 5 (ref 5.0–8.0)

## 2024-10-09 LAB — HCG, QUANTITATIVE, PREGNANCY: hCG, Beta Chain, Quant, S: <1 m[IU]/mL

## 2024-10-09 MED ORDER — NORETHINDRONE ACETATE 5 MG PO TABS: ORAL_TABLET | ORAL | 0 refills | Status: AC

## 2024-10-09 MED ORDER — NORETHINDRONE ACETATE 5 MG PO TABS
10.0000 mg | ORAL_TABLET | Freq: Every day | ORAL | Status: DC
  Administered 2024-10-09: 10 mg via ORAL
  Filled 2024-10-09: qty 2

## 2024-10-09 NOTE — ED Notes (Signed)
 Pt will discharge once norethindrone  delivered from pharmacy.

## 2024-10-09 NOTE — Discharge Instructions (Signed)
 Start your Aygestin  today.  Start taking a multivitamin pill and iron supplementation (you may want to take a daily over-the-counter MiraLAX as well as iron can cause constipation).  Follow-up as soon as possible with gynecology (call for an appointment tomorrow).  Return with any acutely worsening symptoms or any other emergency. -- RETURN PRECAUTIONS & AFTERCARE: (ENGLISH) RETURN PRECAUTIONS: Return immediately to the emergency department or see/call your doctor if you feel worse, weak or have changes in speech or vision, are short of breath, have fever, vomiting, pain, bleeding or dark stool, trouble urinating or any new issues. Return here or see/call your doctor if not improving as expected for your suspected condition. FOLLOW-UP CARE: Call your doctor and/or any doctors we referred you to for more advice and to make an appointment. Do this today, tomorrow or after the weekend. Some doctors only take PPO insurance so if you have HMO insurance you may want to contact your HMO or your regular doctor for referral to a specialist within your plan. Either way tell the doctor's office that it was a referral from the emergency department so you get the soonest possible appointment.  YOUR TEST RESULTS: Take result reports of any blood or urine tests, imaging tests and EKG's to your doctor and any referral doctor. Have any abnormal tests repeated. Your doctor or a referral doctor can let you know when this should be done. Also make sure your doctor contacts this hospital to get any test results that are not currently available such as cultures or special tests for infection and final imaging reports, which are often not available at the time you leave the ER but which may list additional important findings that are not documented on the preliminary report. BLOOD PRESSURE: If your blood pressure was greater than 120/80 have your blood pressure rechecked within 1 to 2 weeks. MEDICATION SIDE EFFECTS: Do not drive,  walk, bike, take the bus, etc. if you have received or are being prescribed any sedating medications such as those for pain or anxiety or certain antihistamines like Benadryl . If you have been give one of these here get a taxi home or have a friend drive you home. Ask your pharmacist to counsel you on potential side effects of any new medication

## 2024-10-09 NOTE — ED Notes (Signed)
 Pt walked to toilet to void for UA sample and got herself back into bed.

## 2024-10-09 NOTE — ED Provider Notes (Signed)
 "  [facility] Provider Note    Event Date/Time   First MD Initiated Contact with Patient 10/09/24 [phone]     (approximate)   History   Vaginal Bleeding   HPI  Bianca Pena is a 33 y.o. female  female with PMH most significant for uterine fibroids and ectopic pregnancy s/p L salpingectomy who presents with a month of heavy vaginal bleeding and abdominal cramping. Patient states that she is passing large clots vaginally and has intermittently felt lightheaded.  Does not have a gynecologist or primary care physician.  Denies any concern for sexually transmitted disease.  Denies any nausea vomiting chest pain shortness of breath abdominal pain changes in urinary or bowel habits.  She was recently seen at [facility] on 10/07/2024 for similar symptoms and had blood work and pregnancy test.  She says that her symptoms have continued since then.      Physical Exam   Triage Vital Signs: ED Triage Vitals [10/09/24 0425]  Encounter Vitals Group     BP 117/85     Girls Systolic BP Percentile      Girls Diastolic BP Percentile      Boys Systolic BP Percentile      Boys Diastolic BP Percentile      Pulse Rate (!) 111     Resp 18     Temp 98.1 F (36.7 C)     Temp Source Oral     SpO2 97 %     Weight 242 lb (109.8 kg)     Height 5' 4 (1.626 m)     Head Circumference      Peak Flow      Pain Score 0     Pain Loc      Pain Education      Exclude from Growth Chart     Most recent vital signs: Vitals:   10/09/24 0700 10/09/24 0900  BP: 105/63 (!) 106/56  Pulse: 90 78  Resp: 20 20  Temp:    SpO2: 97% 97%    Nursing Triage Note reviewed. Vital signs reviewed and patients oxygen saturation is normoxic  General: Patient is well nourished, well developed, awake and alert, resting comfortably in no acute distress Head: Normocephalic and atraumatic Eyes: Normal inspection, extraocular muscles intact, no conjunctival pallor Ear, nose, throat: Normal  external exam Neck: Normal range of motion Respiratory: Patient is in no respiratory distress, lungs CTAB Cardiovascular: Patient is not tachycardic, RRR without murmur appreciated GI: Abd SNT with no guarding or rebound  Back: Normal inspection of the back with good strength and range of motion throughout all ext Extremities: pulses intact with good cap refills, no LE pitting edema or calf tenderness Neuro: The patient is alert and oriented to person, place, and time, appropriately conversive, with 5/5 bilat UE/LE strength, no gross motor or sensory defects noted. Coordination appears to be adequate. Skin: Warm, dry, and intact Psych: normal mood and affect, no SI or HI  ED Results / Procedures / Treatments   Labs (all labs ordered are listed, but only abnormal results are displayed) Labs Reviewed  CBC WITH DIFFERENTIAL/PLATELET - Abnormal; Notable for the following components:      Result Value   RBC 3.47 (*)    Hemoglobin 10.3 (*)    HCT 30.4 (*)    All other components within normal limits  URINALYSIS, ROUTINE W REFLEX MICROSCOPIC - Abnormal; Notable for the following components:   Color, Urine YELLOW (*)  APPearance HAZY (*)    Hgb urine dipstick LARGE (*)    All other components within normal limits  COMPREHENSIVE METABOLIC PANEL WITH GFR - Abnormal; Notable for the following components:   Glucose, Bld 115 (*)    Calcium 8.7 (*)    Total Protein 6.3 (*)    All other components within normal limits  HCG, QUANTITATIVE, PREGNANCY  TYPE AND SCREEN     EKG None  RADIOLOGY US  pelvis    PROCEDURES:  Critical Care performed: No  Procedures   MEDICATIONS ORDERED IN ED: Medications  norethindrone  (AYGESTIN ) tablet 10 mg (10 mg Oral Provided for home use 10/09/24 0925)     IMPRESSION / MDM / ASSESSMENT AND PLAN / ED COURSE                                Differential diagnosis includes, but is not limited to, abnormal uterine bleeding, acute anemia,  pregnancy, fibroid, uterine malignancy   ED course: Patient presents for second ED visit within a week for abnormal uterine bleeding.  She appears well-perfused but her whole hematocrit has dropped 4 points in the last 48 hours.  I reviewed the workup in the ED note from [facility].  She is not at the threshold for blood transfusion.  Pregnancy test is pending but would not expected to be elevated today given negative pregnancy test yesterday.  She has no prior history of coagulation abnormalities but she has a known history of fibroid and suspect this is the source of her abnormal uterine bleeding.  Since she has not had any recent imaging we will get a pelvic ultrasound to confirm.  We discussed choice of OCP which she is now willing to take with OB/GYN.  Anticipate patient will ultimately be able to be discharged but hopefully with medication management and follow-up   Clinical Course as of 10/09/24 0927  Sun Oct 09, 2024  0715 Hemoglobin(!): 10.3 Not at the threshould which would require transfusion, but 4 points less than the 26th  [HD]  0717 Comprehensive metabolic panel(!) No profound electrolyte derangements [HD]  0718 OB paged [HD]  0721 HCG, Beta Chain, Quant, S: <1 negative [HD]  0736 OB paged, currently in a delivery will respond when able.  [HD]  0825 Per OB: I would start her on norethindrone  10 mg TID for 10 days, then taper down over the next few weeks with GYN f/u. This is not technically a contraceptive dose but should help with her bleeding and then she can start a more permanent contraceptive method if she wants. She could also do a COC taper with ethinyl estradiol containing pill like Junel (5 pills on day 1, 4 pills on day 2, etc. down to a maintenance dose of a daily pill) but with her BMI she is at higher risk for blood clots with the estrogen [HD]  [phone] We discussed the benefits risks (low risk of withdrawal bleeding blood clots) and indications of initiating  norethindrone  10 mg TID and patient voiced understanding and opted to proceed.  She understands that this does not serve as a contraceptive means and we will continue to use a barrier instead.  She received her first dose 2-day.  She will initiate a multivitamin and over-the-counter iron supplements.  She will call continued her OB/GYN tomorrow.  I have given her a taper for the next 40 days.  She will return with any acutely worsening  symptoms.  She was given a printout of her pelvic ultrasound and feels comfortable returning home [HD]  0914 Urinalysis, Routine w reflex microscopic -Urine, Clean Catch(!) No evidence of UTI [HD]    Clinical Course User Index [HD] Nicholaus Rolland BRAVO, MD   At time of discharge there is no evidence of acute life, limb, vision, or fertility threat. Patient has stable vital signs, pain is well controlled, patient is ambulatory and p.o. tolerant.  Discharge instructions were completed using the EPIC system. I would refer you to those at this time. All warnings prescriptions follow-up etc. were discussed in detail with the patient. Patient indicates understanding and is agreeable with this plan. All questions answered.  Patient is made aware that they may return to the emergency department for any worsening or new condition or for any other emergency.   -- Risk: 5 This patient has a high risk of morbidity due to further diagnostic testing or treatment. Rationale: This patients evaluation and management involve a high risk of morbidity due to the potential severity of presenting symptoms, need for diagnostic testing, and/or initiation of treatment that may require close monitoring. The differential includes conditions with potential for significant deterioration or requiring escalation of care. Treatment decisions in the ED, including medication administration, procedural interventions, or disposition planning, reflect this level of risk. COPA: 5 The patient has the  following acute or chronic illness/injury that poses a possible threat to life or bodily function: [X] : The patient has a potentially serious acute condition or an acute exacerbation of a chronic illness requiring urgent evaluation and management in the Emergency Department. The clinical presentation necessitates immediate consideration of life-threatening or function-threatening diagnoses, even if they are ultimately ruled out.   FINAL CLINICAL IMPRESSION(S) / ED DIAGNOSES   Final diagnoses:  Abnormal uterine bleeding  Intramural and submucous leiomyoma of uterus  Anemia, unspecified type     Rx / DC Orders   ED Discharge Orders          Ordered    norethindrone  (AYGESTIN ) 5 MG tablet  Multiple Frequencies        10/09/24 0833    Ambulatory Referral to Primary Care (Establish Care)        10/09/24 0833             Note:  This document was prepared using Dragon voice recognition software and may include unintentional dictation errors.   Nicholaus Rolland BRAVO, MD 10/09/24 [phone]  "

## 2024-10-09 NOTE — ED Triage Notes (Signed)
 Pt presents for heavy vaginal bleeding. LMP was 12/12. Endorsing abdominal cramping with the passing of the clots but not between episodes. Endorsing dizziness. Large clots and significant amounts of blood noted. Changing adult diapers frequently. No known pregnancy Hx fibroids.

## 2024-10-09 NOTE — ED Notes (Signed)
 Pt in ultrasound
